# Patient Record
Sex: Female | Born: 1955 | Race: White | Hispanic: No | Marital: Single | Smoking: Former smoker
Health system: Southern US, Community
[De-identification: ages and names within clinical notes are randomized; demographics above are authoritative.]

## PROBLEM LIST (undated history)

## (undated) HISTORY — PX: TONSILLECTOMY: SUR1361

---

## 1999-04-15 ENCOUNTER — Other Ambulatory Visit: Admission: RE | Admit: 1999-04-15 | Discharge: 1999-04-15 | Payer: Self-pay | Admitting: Family Medicine

## 2001-11-22 ENCOUNTER — Other Ambulatory Visit: Admission: RE | Admit: 2001-11-22 | Discharge: 2001-11-22 | Payer: Self-pay | Admitting: Family Medicine

## 2002-04-04 ENCOUNTER — Encounter: Payer: Self-pay | Admitting: Family Medicine

## 2002-04-04 ENCOUNTER — Encounter: Admission: RE | Admit: 2002-04-04 | Discharge: 2002-04-04 | Payer: Self-pay | Admitting: Family Medicine

## 2003-06-03 ENCOUNTER — Encounter: Admission: RE | Admit: 2003-06-03 | Discharge: 2003-06-03 | Payer: Self-pay | Admitting: Family Medicine

## 2005-09-17 ENCOUNTER — Other Ambulatory Visit: Admission: RE | Admit: 2005-09-17 | Discharge: 2005-09-17 | Payer: Self-pay | Admitting: Family Medicine

## 2014-01-26 ENCOUNTER — Emergency Department (HOSPITAL_COMMUNITY): Payer: Managed Care, Other (non HMO)

## 2014-01-26 ENCOUNTER — Inpatient Hospital Stay (HOSPITAL_COMMUNITY)
Admission: EM | Admit: 2014-01-26 | Discharge: 2014-02-03 | DRG: 200 | Disposition: A | Discharge status: Home / Self Care | Payer: Managed Care, Other (non HMO) | Attending: Surgery | Admitting: Surgery

## 2014-01-26 ENCOUNTER — Encounter (HOSPITAL_COMMUNITY): Payer: Self-pay | Admitting: Emergency Medicine

## 2014-01-26 DIAGNOSIS — J939 Pneumothorax, unspecified: Secondary | ICD-10-CM

## 2014-01-26 DIAGNOSIS — S0180XA Unspecified open wound of other part of head, initial encounter: Secondary | ICD-10-CM | POA: Diagnosis present

## 2014-01-26 DIAGNOSIS — S02609A Fracture of mandible, unspecified, initial encounter for closed fracture: Secondary | ICD-10-CM | POA: Diagnosis present

## 2014-01-26 DIAGNOSIS — I959 Hypotension, unspecified: Secondary | ICD-10-CM | POA: Diagnosis not present

## 2014-01-26 DIAGNOSIS — Z87891 Personal history of nicotine dependence: Secondary | ICD-10-CM | POA: Diagnosis not present

## 2014-01-26 DIAGNOSIS — D62 Acute posthemorrhagic anemia: Secondary | ICD-10-CM | POA: Diagnosis not present

## 2014-01-26 DIAGNOSIS — S0003XA Contusion of scalp, initial encounter: Secondary | ICD-10-CM | POA: Diagnosis present

## 2014-01-26 DIAGNOSIS — S2243XA Multiple fractures of ribs, bilateral, initial encounter for closed fracture: Secondary | ICD-10-CM | POA: Diagnosis present

## 2014-01-26 DIAGNOSIS — Z880 Allergy status to penicillin: Secondary | ICD-10-CM

## 2014-01-26 DIAGNOSIS — S1093XA Contusion of unspecified part of neck, initial encounter: Secondary | ICD-10-CM

## 2014-01-26 DIAGNOSIS — J9819 Other pulmonary collapse: Secondary | ICD-10-CM | POA: Diagnosis present

## 2014-01-26 DIAGNOSIS — S2249XA Multiple fractures of ribs, unspecified side, initial encounter for closed fracture: Secondary | ICD-10-CM | POA: Diagnosis present

## 2014-01-26 DIAGNOSIS — J9383 Other pneumothorax: Secondary | ICD-10-CM | POA: Diagnosis present

## 2014-01-26 DIAGNOSIS — S271XXA Traumatic hemothorax, initial encounter: Principal | ICD-10-CM | POA: Diagnosis present

## 2014-01-26 DIAGNOSIS — Z888 Allergy status to other drugs, medicaments and biological substances status: Secondary | ICD-10-CM | POA: Diagnosis not present

## 2014-01-26 DIAGNOSIS — W64XXXA Exposure to other animate mechanical forces, initial encounter: Secondary | ICD-10-CM | POA: Diagnosis present

## 2014-01-26 DIAGNOSIS — S20219A Contusion of unspecified front wall of thorax, initial encounter: Secondary | ICD-10-CM | POA: Diagnosis present

## 2014-01-26 DIAGNOSIS — S0083XA Contusion of other part of head, initial encounter: Secondary | ICD-10-CM | POA: Diagnosis present

## 2014-01-26 DIAGNOSIS — S272XXA Traumatic hemopneumothorax, initial encounter: Secondary | ICD-10-CM

## 2014-01-26 DIAGNOSIS — S0181XA Laceration without foreign body of other part of head, initial encounter: Secondary | ICD-10-CM | POA: Diagnosis present

## 2014-01-26 LAB — COMPREHENSIVE METABOLIC PANEL
ALT: 16 U/L (ref 0–35)
AST: 22 U/L (ref 0–37)
Albumin: 3.9 g/dL (ref 3.5–5.2)
Alkaline Phosphatase: 74 U/L (ref 39–117)
Anion gap: 13 (ref 5–15)
BUN: 21 mg/dL (ref 6–23)
CALCIUM: 9.6 mg/dL (ref 8.4–10.5)
CO2: 24 mEq/L (ref 19–32)
Chloride: 101 mEq/L (ref 96–112)
Creatinine, Ser: 0.88 mg/dL (ref 0.50–1.10)
GFR calc non Af Amer: 72 mL/min — ABNORMAL LOW (ref >90)
GFR, EST AFRICAN AMERICAN: 84 mL/min — AB (ref >90)
Glucose, Bld: 155 mg/dL — ABNORMAL HIGH (ref 70–99)
Potassium: 3.5 mEq/L — ABNORMAL LOW (ref 3.7–5.3)
SODIUM: 138 meq/L (ref 137–147)
TOTAL PROTEIN: 6.6 g/dL (ref 6.0–8.3)
Total Bilirubin: 0.3 mg/dL (ref 0.3–1.2)

## 2014-01-26 LAB — CBC
HCT: 39.8 % (ref 36.0–46.0)
HEMOGLOBIN: 13.7 g/dL (ref 12.0–15.0)
MCH: 30.4 pg (ref 26.0–34.0)
MCHC: 34.4 g/dL (ref 30.0–36.0)
MCV: 88.2 fL (ref 78.0–100.0)
Platelets: 291 10*3/uL (ref 150–400)
RBC: 4.51 MIL/uL (ref 3.87–5.11)
RDW: 12.7 % (ref 11.5–15.5)
WBC: 21.1 10*3/uL — ABNORMAL HIGH (ref 4.0–10.5)

## 2014-01-26 LAB — CDS SEROLOGY

## 2014-01-26 LAB — SAMPLE TO BLOOD BANK

## 2014-01-26 LAB — PROTIME-INR
INR: 1 (ref 0.00–1.49)
Prothrombin Time: 13.2 seconds (ref 11.6–15.2)

## 2014-01-26 LAB — ETHANOL

## 2014-01-26 MED ORDER — MORPHINE SULFATE 2 MG/ML IJ SOLN
INTRAMUSCULAR | Status: AC
  Administered 2014-01-26: 2 mg via INTRAVENOUS
  Filled 2014-01-26: qty 2

## 2014-01-26 MED ORDER — MIDAZOLAM HCL 2 MG/2ML IJ SOLN
INTRAMUSCULAR | Status: AC
Start: 2014-01-26 — End: 2014-01-26
  Administered 2014-01-26: 2 mg
  Administered 2014-01-26: 2 mg via INTRAVENOUS
  Filled 2014-01-26: qty 4

## 2014-01-26 MED ORDER — FENTANYL CITRATE 0.05 MG/ML IJ SOLN
50.0000 ug | Freq: Once | INTRAMUSCULAR | Status: AC
  Administered 2014-01-26: 50 ug via INTRAVENOUS

## 2014-01-26 MED ORDER — PANTOPRAZOLE SODIUM 40 MG PO TBEC
40.0000 mg | DELAYED_RELEASE_TABLET | Freq: Every day | ORAL | Status: DC
  Administered 2014-01-27: 40 mg via ORAL
  Filled 2014-01-26: qty 1

## 2014-01-26 MED ORDER — HYDROMORPHONE HCL 1 MG/ML IJ SOLN
1.0000 mg | INTRAMUSCULAR | Status: DC | PRN
  Administered 2014-01-27 – 2014-01-29 (×7): 1 mg via INTRAVENOUS
  Filled 2014-01-26 (×3): qty 1

## 2014-01-26 MED ORDER — HYDROMORPHONE HCL 1 MG/ML IJ SOLN
1.0000 mg | INTRAMUSCULAR | Status: DC | PRN
  Administered 2014-01-27 – 2014-01-29 (×4): 1 mg via INTRAVENOUS
  Filled 2014-01-26 (×8): qty 1

## 2014-01-26 MED ORDER — SODIUM CHLORIDE 0.9 % IV BOLUS (SEPSIS)
500.0000 mL | Freq: Once | INTRAVENOUS | Status: AC
  Administered 2014-01-26: 500 mL via INTRAVENOUS

## 2014-01-26 MED ORDER — ONDANSETRON HCL 4 MG/2ML IJ SOLN
4.0000 mg | Freq: Four times a day (QID) | INTRAMUSCULAR | Status: DC | PRN
  Administered 2014-01-27 – 2014-01-29 (×4): 4 mg via INTRAVENOUS
  Filled 2014-01-26 (×4): qty 2

## 2014-01-26 MED ORDER — IOHEXOL 300 MG/ML  SOLN
100.0000 mL | Freq: Once | INTRAMUSCULAR | Status: AC | PRN
  Administered 2014-01-26: 100 mL via INTRAVENOUS

## 2014-01-26 MED ORDER — DEXTROSE-NACL 5-0.9 % IV SOLN
INTRAVENOUS | Status: DC
  Administered 2014-01-26 – 2014-01-27 (×2): via INTRAVENOUS

## 2014-01-26 MED ORDER — CLINDAMYCIN PHOSPHATE 600 MG/50ML IV SOLN
600.0000 mg | Freq: Once | INTRAVENOUS | Status: AC
  Administered 2014-01-26: 600 mg via INTRAVENOUS
  Filled 2014-01-26: qty 50

## 2014-01-26 MED ORDER — OXYCODONE HCL 5 MG PO TABS
10.0000 mg | ORAL_TABLET | ORAL | Status: DC | PRN
  Administered 2014-01-27 (×2): 10 mg via ORAL
  Administered 2014-01-28: 5 mg via ORAL
  Administered 2014-01-28 – 2014-01-29 (×3): 10 mg via ORAL
  Filled 2014-01-26 (×6): qty 2

## 2014-01-26 MED ORDER — ENOXAPARIN SODIUM 40 MG/0.4ML ~~LOC~~ SOLN
40.0000 mg | SUBCUTANEOUS | Status: DC
  Administered 2014-01-29: 40 mg via SUBCUTANEOUS
  Filled 2014-01-26 (×4): qty 0.4

## 2014-01-26 MED ORDER — MORPHINE SULFATE 2 MG/ML IJ SOLN
INTRAMUSCULAR | Status: AC
  Filled 2014-01-26: qty 1

## 2014-01-26 MED ORDER — LIDOCAINE-EPINEPHRINE 1 %-1:100000 IJ SOLN
30.0000 mL | Freq: Once | INTRAMUSCULAR | Status: AC
  Administered 2014-01-26: 30 mL via INTRADERMAL
  Filled 2014-01-26: qty 1

## 2014-01-26 MED ORDER — PANTOPRAZOLE SODIUM 40 MG IV SOLR
40.0000 mg | Freq: Every day | INTRAVENOUS | Status: DC
  Administered 2014-01-26 – 2014-01-28 (×2): 40 mg via INTRAVENOUS
  Filled 2014-01-26 (×3): qty 40

## 2014-01-26 MED ORDER — FENTANYL CITRATE 0.05 MG/ML IJ SOLN
INTRAMUSCULAR | Status: AC
  Filled 2014-01-26: qty 2

## 2014-01-26 MED ORDER — ONDANSETRON HCL 4 MG PO TABS: 4.0000 mg | ORAL_TABLET | Freq: Four times a day (QID) | ORAL | Status: DC | PRN

## 2014-01-26 NOTE — Procedures (Signed)
Chest Tube Insertion Procedure Note left   Indications:  Clinically significant Pneumothorax left   Pre-operative Diagnosis: Pneumothorax  Post-operative Diagnosis: Pneumothorax  Procedure Details  Informed consent was obtained for the procedure, including sedation.  Risks of lung perforation, hemorrhage, arrhythmia, and adverse drug reaction were discussed.   After sterile skin prep, using standard technique, a 28 French tube was placed in the left anterior 5 th rib space.  Findings: Air and small amount of fluid   Estimated Blood Loss:  Minimal         Specimens:  None              Complications:  None; patient tolerated the procedure well.         Disposition: to SDU stable         Condition: stable  Attending Attestation: I performed the procedure.

## 2014-01-26 NOTE — ED Notes (Signed)
Patient presents via EMS  She was walking her horses and I got spooked.  She got tangled up in them and fell to the ground.  Laceration above the right eye, abrasion to the chin, road rash to the right upper arm.  EMS reports flail chest to the left with + breath sounds.  IV 18 gauge in the left hand  Fentanyl given

## 2014-01-26 NOTE — ED Notes (Signed)
CT inserted by Dr Luisa Hart, dressing applied, low suction

## 2014-01-26 NOTE — ED Provider Notes (Signed)
CSN: [id number]     Arrival date & time 01/26/14  1856 History   First MD Initiated Contact with Patient 01/26/14 1919     Chief Complaint  Patient presents with  . Fall     (Consider location/radiation/quality/duration/timing/severity/associated sxs/prior Treatment) HPI Comments: Pt brought in by EMS.  She was walking her horses and they got spooked.  They knocked her to the ground and she go trampled by one of the horses.  She complains mostly of pain to her left ribs.  Pain is constant and worse with movement, breathing.  No SOB.  Has pain to her chin with associated lacerations.  Not sure if she had a complete LOC.  No neck or back pain.   History reviewed. No pertinent past medical history. Past Surgical History  Procedure Laterality Date  . Tonsillectomy     No family history on file. History  Substance Use Topics  . Smoking status: Former Games developer  . Smokeless tobacco: Never Used  . Alcohol Use: Yes     Comment: ocassionally   OB History   Grav Para Term Preterm Abortions TAB SAB Ect Mult Living                 Review of Systems  Constitutional: Negative for fever, chills, diaphoresis and fatigue.  HENT: Positive for facial swelling. Negative for congestion, rhinorrhea and sneezing.   Eyes: Negative.   Respiratory: Negative for cough, chest tightness and shortness of breath.   Cardiovascular: Positive for chest pain. Negative for leg swelling.  Gastrointestinal: Negative for nausea, vomiting, abdominal pain, diarrhea and blood in stool.  Genitourinary: Negative for frequency, hematuria, flank pain and difficulty urinating.  Musculoskeletal: Negative for arthralgias, back pain and neck pain.  Skin: Positive for wound. Negative for rash.  Neurological: Negative for dizziness, speech difficulty, weakness, numbness and headaches.      Allergies  Penicillins and Other  Home Medications   Prior to Admission medications   Medication Sig Start Date End Date Taking?  Authorizing Provider  ibuprofen (ADVIL,MOTRIN) 200 MG tablet Take 400 mg by mouth every 6 (six) hours as needed for moderate pain.   Yes Historical Provider, MD   BP 95/62  Pulse 81  Temp(Src) 98.1 F (36.7 C) (Oral)  Resp 18  Ht 5' 6.5" (1.689 m)  Wt 136 lb (61.689 kg)  BMI 21.62 kg/m2  SpO2 96% Physical Exam  Constitutional: She is oriented to person, place, and time. She appears well-developed and well-nourished.  HENT:  Head: Normocephalic and atraumatic.  Abrasion/laceration to chin.  Small areas of chipped teeth.  Small laceration to inner aspect of lower lip. 1cm laceration just inferior to lower lip.  Does not cross vermilian border.  Two 1cm lacerations to underside of chin.  0.5 laceration just superior to right eyebrow  Eyes: Pupils are equal, round, and reactive to light.  Neck: Normal range of motion. Neck supple.  No pain to the cervical, thoracic or LS spine  Cardiovascular: Normal rate, regular rhythm and normal heart sounds.   Pulmonary/Chest: Effort normal and breath sounds normal. No respiratory distress. She has no wheezes. She has no rales. She exhibits tenderness.  +hematoma, tenderness, abrasion to left posterior chest with underlying crepitus.  No flail segment appreciated  Abdominal: Soft. Bowel sounds are normal. There is tenderness (some TTP LUQ). There is no rebound and no guarding.  Musculoskeletal: Normal range of motion. She exhibits no edema.  Abrasion to right posterior shoulder.  No pain on palpation or  ROM of extremities  Lymphadenopathy:    She has no cervical adenopathy.  Neurological: She is alert and oriented to person, place, and time. She has normal strength. No sensory deficit.  Skin: Skin is warm and dry. No rash noted.  Psychiatric: She has a normal mood and affect.    ED Course  LACERATION REPAIR Date/Time: 01/27/2014 12:04 AM Performed by: Saphyre Cillo Authorized by: Rolan Bucco Consent: Verbal consent obtained. Risks and  benefits: risks, benefits and alternatives were discussed Consent given by: patient and spouse Body area: head/neck Laceration length: 3.5 cm Anesthesia: local infiltration Local anesthetic: lidocaine 1% with epinephrine and lidocaine 1% without epinephrine Anesthetic total: 6 ml Preparation: Patient was prepped and draped in the usual sterile fashion. Irrigation solution: saline Irrigation method: syringe Amount of cleaning: standard Debridement: none Degree of undermining: none Skin closure: 6-0 Prolene and glue Comments: The laceration to the forehead was closed with glue.  The 3 lacerations to the chin had 4, 3, 4 sutures placed.   (including critical care time) Labs Review Labs Reviewed  COMPREHENSIVE METABOLIC PANEL - Abnormal; Notable for the following:    Potassium 3.5 (*)    Glucose, Bld 155 (*)    GFR calc non Af Amer 72 (*)    GFR calc Af Amer 84 (*)    All other components within normal limits  CBC - Abnormal; Notable for the following:    WBC 21.1 (*)    All other components within normal limits  CDS SEROLOGY  ETHANOL  PROTIME-INR  CBC  COMPREHENSIVE METABOLIC PANEL  SAMPLE TO BLOOD BANK    Imaging Review Ct Head Wo Contrast  01/26/2014   CLINICAL DATA:  Coarse injury, fall, laceration above right eye.  EXAM: CT HEAD WITHOUT CONTRAST  CT MAXILLOFACIAL WITHOUT CONTRAST  CT CERVICAL SPINE WITHOUT CONTRAST  TECHNIQUE: Multidetector CT imaging of the head, cervical spine, and maxillofacial structures were performed using the standard protocol without intravenous contrast. Multiplanar CT image reconstructions of the cervical spine and maxillofacial structures were also generated.  COMPARISON:  None.  FINDINGS: CT HEAD FINDINGS  No intracranial hemorrhage. No parenchymal contusion. No midline shift or mass effect. Basilar cisterns are patent. No skull base fracture. No fluid in the paranasal sinuses or mastoid air cells. Orbits are normal.  CT MAXILLOFACIAL FINDINGS  No  evidence of orbital rim fracture. The zygomatic arches are intact. The globes are normal the intraconal contents are normal. There is a small skin laceration over the right orbit. No evidence of maxillary bone fracture. There is no fluid in the maxillary sinuses. The pterygoid plates are normal. The maxillary bone is normal.  The mandibular condyles are located. There is nondisplaced fracture through the angle of the left mandible (image 28, series 302 and image 52, series 308.) This fracture extends from the angle of the mandible anterior to the alveolar ridge.  No injury to the deep soft tissues of face.  CT CERVICAL SPINE FINDINGS  No prevertebral soft tissue swelling. Normal alignment of cervical vertebral bodies. No loss of vertebral body height. Normal facet articulation. Normal craniocervical junction.  No evidence epidural or paraspinal hematoma.  There is a left pneumothorax. Gas dissects into the para spinal musculature on the left.  Danella Deis: 1. No intracranial trauma. 2. Left mandibular fracture extending from the angle the gel to the alveolar ridge. 3. Small laceration over the right orbit. 4. No cervical spine fracture. 5. Left pneumothorax with gas dissecting into the left paraspinal musculature.  Findings conveyed toMELANIE Dorleen Kissel on 01/26/2014  at20:46.   Electronically Signed   By: Genevive Bi M.D.   On: 01/26/2014 20:48   Ct Chest W Contrast  01/26/2014   CLINICAL DATA:  Patient status post fall horse.  EXAM: CT CHEST, ABDOMEN, AND PELVIS WITH CONTRAST  TECHNIQUE: Multidetector CT imaging of the chest, abdomen and pelvis was performed following the standard protocol during bolus administration of intravenous contrast.  CONTRAST:  OMNIPAQUE IOHEXOL 300 MG/ML  SOLN  COMPARISON:  None.  FINDINGS: CT CHEST FINDINGS  Visualized thyroid is unremarkable. No enlarged axillary, mediastinal or hilar lymphadenopathy. Normal heart size. No pericardial effusion. Aorta and main pulmonary artery  normal in caliber.  There is a moderate sized left-sided pneumothorax. Small left-sided pleural effusion. There are ground-glass and consolidative opacities within the collapsed left upper, and left lower lobes. Dependent ground-glass opacities within the right lower lobe, likely atelectasis.  CT ABDOMEN AND PELVIS FINDINGS  There is a 2.9 cm cyst within the posterior right hepatic lobe. There is an additional adjacent 1 cm cyst in the right hepatic lobe. Fatty deposition adjacent to the falciform ligament.  Spleen, pancreas and bilateral adrenal glands are unremarkable. 1 cm simple cyst within the superior pole of the left kidney.  Normal caliber abdominal aorta. No retroperitoneal lymphadenopathy. Urinary bladder is mildly distended. Uterus is unremarkable.  The stool is present throughout the colon. Additionally there is flocculent material demonstrated within the distal small bowel. Normal appendix.  There is a minimally displaced oblique fracture through the lateral left eighth rib. Markedly displaced comminuted fractures through the posterior and lateral left ninth, tenth and eleventh ribs. Minimally displaced fracture of the posterior left twelfth rib. Extensive subcutaneous emphysema within the left chest wall soft tissues. Nondisplaced fracture through the lateral aspect of the right first rib. Nondisplaced fracture through the lateral aspect of the right fourth rib.  IMPRESSION: 1. Multiple markedly comminuted and displaced left posterior and lateral rib fractures. 2. Moderate left pneumothorax.  Small left pleural effusion. 3. Consolidative opacities within the left upper and left lower lobes which may represent a combination of pulmonary contusion and atelectasis. 4. Nondisplaced right first and fourth rib fractures. 5. Extensive subcutaneous emphysema within left chest wall and dorsal soft tissues. 6. Stool throughout the colon as can be seen with constipation. Critical Value/emergent results were  called by telephone at the time of interpretation on 01/26/2014 at 8:51 pm to Dr. Shawna Orleans Kierstynn Babich , who verbally acknowledged these results.   Electronically Signed   By: Annia Belt M.D.   On: 01/26/2014 20:53   Ct Cervical Spine Wo Contrast  01/26/2014   CLINICAL DATA:  Coarse injury, fall, laceration above right eye.  EXAM: CT HEAD WITHOUT CONTRAST  CT MAXILLOFACIAL WITHOUT CONTRAST  CT CERVICAL SPINE WITHOUT CONTRAST  TECHNIQUE: Multidetector CT imaging of the head, cervical spine, and maxillofacial structures were performed using the standard protocol without intravenous contrast. Multiplanar CT image reconstructions of the cervical spine and maxillofacial structures were also generated.  COMPARISON:  None.  FINDINGS: CT HEAD FINDINGS  No intracranial hemorrhage. No parenchymal contusion. No midline shift or mass effect. Basilar cisterns are patent. No skull base fracture. No fluid in the paranasal sinuses or mastoid air cells. Orbits are normal.  CT MAXILLOFACIAL FINDINGS  No evidence of orbital rim fracture. The zygomatic arches are intact. The globes are normal the intraconal contents are normal. There is a small skin laceration over the right orbit. No evidence of maxillary bone  fracture. There is no fluid in the maxillary sinuses. The pterygoid plates are normal. The maxillary bone is normal.  The mandibular condyles are located. There is nondisplaced fracture through the angle of the left mandible (image 28, series 302 and image 52, series 308.) This fracture extends from the angle of the mandible anterior to the alveolar ridge.  No injury to the deep soft tissues of face.  CT CERVICAL SPINE FINDINGS  No prevertebral soft tissue swelling. Normal alignment of cervical vertebral bodies. No loss of vertebral body height. Normal facet articulation. Normal craniocervical junction.  No evidence epidural or paraspinal hematoma.  There is a left pneumothorax. Gas dissects into the para spinal musculature on the  left.  Danella Deis: 1. No intracranial trauma. 2. Left mandibular fracture extending from the angle the gel to the alveolar ridge. 3. Small laceration over the right orbit. 4. No cervical spine fracture. 5. Left pneumothorax with gas dissecting into the left paraspinal musculature. Findings conveyed toMELANIE Muzamil Harker on 01/26/2014  at20:46.   Electronically Signed   By: Genevive Bi M.D.   On: 01/26/2014 20:48   Ct Abdomen Pelvis W Contrast  01/26/2014   CLINICAL DATA:  Patient status post fall horse.  EXAM: CT CHEST, ABDOMEN, AND PELVIS WITH CONTRAST  TECHNIQUE: Multidetector CT imaging of the chest, abdomen and pelvis was performed following the standard protocol during bolus administration of intravenous contrast.  CONTRAST:  OMNIPAQUE IOHEXOL 300 MG/ML  SOLN  COMPARISON:  None.  FINDINGS: CT CHEST FINDINGS  Visualized thyroid is unremarkable. No enlarged axillary, mediastinal or hilar lymphadenopathy. Normal heart size. No pericardial effusion. Aorta and main pulmonary artery normal in caliber.  There is a moderate sized left-sided pneumothorax. Small left-sided pleural effusion. There are ground-glass and consolidative opacities within the collapsed left upper, and left lower lobes. Dependent ground-glass opacities within the right lower lobe, likely atelectasis.  CT ABDOMEN AND PELVIS FINDINGS  There is a 2.9 cm cyst within the posterior right hepatic lobe. There is an additional adjacent 1 cm cyst in the right hepatic lobe. Fatty deposition adjacent to the falciform ligament.  Spleen, pancreas and bilateral adrenal glands are unremarkable. 1 cm simple cyst within the superior pole of the left kidney.  Normal caliber abdominal aorta. No retroperitoneal lymphadenopathy. Urinary bladder is mildly distended. Uterus is unremarkable.  The stool is present throughout the colon. Additionally there is flocculent material demonstrated within the distal small bowel. Normal appendix.  There is a minimally  displaced oblique fracture through the lateral left eighth rib. Markedly displaced comminuted fractures through the posterior and lateral left ninth, tenth and eleventh ribs. Minimally displaced fracture of the posterior left twelfth rib. Extensive subcutaneous emphysema within the left chest wall soft tissues. Nondisplaced fracture through the lateral aspect of the right first rib. Nondisplaced fracture through the lateral aspect of the right fourth rib.  IMPRESSION: 1. Multiple markedly comminuted and displaced left posterior and lateral rib fractures. 2. Moderate left pneumothorax.  Small left pleural effusion. 3. Consolidative opacities within the left upper and left lower lobes which may represent a combination of pulmonary contusion and atelectasis. 4. Nondisplaced right first and fourth rib fractures. 5. Extensive subcutaneous emphysema within left chest wall and dorsal soft tissues. 6. Stool throughout the colon as can be seen with constipation. Critical Value/emergent results were called by telephone at the time of interpretation on 01/26/2014 at 8:51 pm to Dr. Shawna Orleans Eddis Pingleton , who verbally acknowledged these results.   Electronically Signed  By: Annia Belt M.D.   On: 01/26/2014 20:53   Dg Pelvis Portable  01/26/2014   CLINICAL DATA:  Trauma, fall from horse  EXAM: PORTABLE PELVIS 1-2 VIEWS  COMPARISON:  None.  FINDINGS: Hips are located. No evidence of pelvic fracture or sacral fracture. No evidence of femoral neck fracture on single view.  IMPRESSION: No evidence of pelvic fracture.   Electronically Signed   By: Genevive Bi M.D.   On: 01/26/2014 19:58   Dg Chest Portable 1 View  01/26/2014   CLINICAL DATA:  Pneumothorax following trauma  EXAM: PORTABLE CHEST - 1 VIEW  COMPARISON:  CT 01/26/2014  FINDINGS: Interval placement left-sided chest to. There is interval expansion of the left lung. The pleural edge is difficult to define at the left lung apex. Posterior left rib fractures are noted.  Pulmonary contusion at the left lung base noted.  IMPRESSION: 1. Interval expansion of left lung following chest tube placement. 2. Posterior left rib fractures and pulmonary contusion again noted.   Electronically Signed   By: Genevive Bi M.D.   On: 01/26/2014 21:54   Dg Chest Port 1 View  01/26/2014   CLINICAL DATA:  Trauma.  Fell from horse.  EXAM: PORTABLE CHEST - 1 VIEW  COMPARISON:  None.  FINDINGS: Cardiac silhouette is normal in size. No mediastinal or hilar masses.  There are mildly thickened interstitial markings. Mild hazy opacity projects at the left lung base. This may be in part due to extend circulation of opacity from rotation and overlying soft tissue. No evidence of pulmonary edema. No pneumothorax or pleural effusion.  Probable fracture of the lateral left tenth rib. A small amount of deep soft tissue air is seen adjacent to this. No other convincing fracture.  IMPRESSION: Probable fracture of the lateral left tenth rib supported by a small amount of deep soft tissue air.  Opacity in the left lung base is likely atelectasis in combination with chronic interstitial thickening. Lung contusion is possible.  No pleural effusion or pneumothorax.  No other acute findings.   Electronically Signed   By: Amie Portland M.D.   On: 01/26/2014 19:59   Ct Maxillofacial Wo Cm  01/26/2014   CLINICAL DATA:  Coarse injury, fall, laceration above right eye.  EXAM: CT HEAD WITHOUT CONTRAST  CT MAXILLOFACIAL WITHOUT CONTRAST  CT CERVICAL SPINE WITHOUT CONTRAST  TECHNIQUE: Multidetector CT imaging of the head, cervical spine, and maxillofacial structures were performed using the standard protocol without intravenous contrast. Multiplanar CT image reconstructions of the cervical spine and maxillofacial structures were also generated.  COMPARISON:  None.  FINDINGS: CT HEAD FINDINGS  No intracranial hemorrhage. No parenchymal contusion. No midline shift or mass effect. Basilar cisterns are patent. No skull base  fracture. No fluid in the paranasal sinuses or mastoid air cells. Orbits are normal.  CT MAXILLOFACIAL FINDINGS  No evidence of orbital rim fracture. The zygomatic arches are intact. The globes are normal the intraconal contents are normal. There is a small skin laceration over the right orbit. No evidence of maxillary bone fracture. There is no fluid in the maxillary sinuses. The pterygoid plates are normal. The maxillary bone is normal.  The mandibular condyles are located. There is nondisplaced fracture through the angle of the left mandible (image 28, series 302 and image 52, series 308.) This fracture extends from the angle of the mandible anterior to the alveolar ridge.  No injury to the deep soft tissues of face.  CT CERVICAL SPINE FINDINGS  No prevertebral soft tissue swelling. Normal alignment of cervical vertebral bodies. No loss of vertebral body height. Normal facet articulation. Normal craniocervical junction.  No evidence epidural or paraspinal hematoma.  There is a left pneumothorax. Gas dissects into the para spinal musculature on the left.  Danella Deis: 1. No intracranial trauma. 2. Left mandibular fracture extending from the angle the gel to the alveolar ridge. 3. Small laceration over the right orbit. 4. No cervical spine fracture. 5. Left pneumothorax with gas dissecting into the left paraspinal musculature. Findings conveyed toMELANIE Graycen Degan on 01/26/2014  at20:46.   Electronically Signed   By: Genevive Bi M.D.   On: 01/26/2014 20:48     EKG Interpretation None      MDM   Final diagnoses:  Pneumothorax  Facial laceration, initial encounter    Level 2 trauma activated my myself on pt arrival due to large amount of crepitus to posterior chest wall.  Patient had a pneumothorax with a large amount air in the subcutaneous tissues. The trauma surgeon place a chest tube. I contacted the oral surgeon, Dr. Chales Salmon, on call regarding the mandibular fracture. He states he will see the  patient in the morning. The patient requests that a plastic surgeon repair her facial wounds.  I recontacted Dr. Chales Salmon who requests that I repair the wounds and if he needs to revise it in the morning, he will do that.  Pt okay with that plan.  Pt started on clindamycin.  Tdap UTD.    Rolan Bucco, MD 01/27/14 [phone]

## 2014-01-26 NOTE — Progress Notes (Signed)
Chaplain met with family as nurses were working with the pt. Chaplain explored some of the history of the incident and offered emotional support for the family present. Family is very calm and attentive. Chaplain escorted them to consult B as they wait for pt to arrive from CT.  Delford Field 01/26/2014 7:58 PM

## 2014-01-26 NOTE — H&P (Signed)
History   Andrea Shaw is an 58 y.o. female.   Chief Complaint:  Chief Complaint  Patient presents with  . Fall    Fall This is a new problem. The current episode started today. The problem has been gradually worsening. Associated symptoms include chest pain, headaches and weakness.  Pt stomped by horse on left chest... Complaining of left sided pain and face pain. Pain worse with movement and breathing and better at rest.   History reviewed. No pertinent past medical history.  Past Surgical History  Procedure Laterality Date  . Tonsillectomy      No family history on file. Social History:  reports that she has quit smoking. She has never used smokeless tobacco. She reports that she drinks alcohol. She reports that she does not use illicit drugs.  Allergies   Allergies  Allergen Reactions  . Penicillins Rash  . Other Other (See Comments)    [name] causes eye redness    Home Medications   (Not in a hospital admission)  Trauma Course   Results for orders placed during the hospital encounter of 01/26/14 (from the past 48 hour(s))  CDS SEROLOGY     Status: None   Collection Time    01/26/14  7:54 PM      Result Value Ref Range   CDS serology specimen       Value: SPECIMEN WILL BE HELD FOR 14 DAYS IF TESTING IS REQUIRED  COMPREHENSIVE METABOLIC PANEL     Status: Abnormal   Collection Time    01/26/14  7:54 PM      Result Value Ref Range   Sodium 138  137 - 147 mEq/L   Potassium 3.5 (*) 3.7 - 5.3 mEq/L   Chloride 101  96 - 112 mEq/L   CO2 24  19 - 32 mEq/L   Glucose, Bld 155 (*) 70 - 99 mg/dL   BUN 21  6 - 23 mg/dL   Creatinine, Ser 0.88  0.50 - 1.10 mg/dL   Calcium 9.6  8.4 - 10.5 mg/dL   Total Protein 6.6  6.0 - 8.3 g/dL   Albumin 3.9  3.5 - 5.2 g/dL   AST 22  0 - 37 U/L   ALT 16  0 - 35 U/L   Alkaline Phosphatase 74  39 - 117 U/L   Total Bilirubin 0.3  0.3 - 1.2 mg/dL   GFR calc non Af Amer 72 (*) >90 mL/min   GFR calc Af Amer 84 (*) >90 mL/min   Comment: (NOTE)     The eGFR has been calculated using the CKD EPI equation.     This calculation has not been validated in all clinical situations.     eGFR's persistently <90 mL/min signify possible Chronic Kidney     Disease.   Anion gap 13  5 - 15  CBC     Status: Abnormal   Collection Time    01/26/14  7:54 PM      Result Value Ref Range   WBC 21.1 (*) 4.0 - 10.5 K/uL   RBC 4.51  3.87 - 5.11 MIL/uL   Hemoglobin 13.7  12.0 - 15.0 g/dL   HCT 39.8  36.0 - 46.0 %   MCV 88.2  78.0 - 100.0 fL   MCH 30.4  26.0 - 34.0 pg   MCHC 34.4  30.0 - 36.0 g/dL   RDW 12.7  11.5 - 15.5 %   Platelets 291  150 - 400 K/uL  ETHANOL  Status: None   Collection Time    01/26/14  7:54 PM      Result Value Ref Range   Alcohol, Ethyl (B) <11  0 - 11 mg/dL   Comment:            LOWEST DETECTABLE LIMIT FOR     SERUM ALCOHOL IS 11 mg/dL     FOR MEDICAL PURPOSES ONLY  PROTIME-INR     Status: None   Collection Time    01/26/14  7:54 PM      Result Value Ref Range   Prothrombin Time 13.2  11.6 - 15.2 seconds   INR 1.00  0.00 - 1.49  SAMPLE TO BLOOD BANK     Status: None   Collection Time    01/26/14  7:54 PM      Result Value Ref Range   Blood Bank Specimen SAMPLE AVAILABLE FOR TESTING     Sample Expiration 01/27/2014     Ct Head Wo Contrast  01/26/2014   CLINICAL DATA:  Coarse injury, fall, laceration above right eye.  EXAM: CT HEAD WITHOUT CONTRAST  CT MAXILLOFACIAL WITHOUT CONTRAST  CT CERVICAL SPINE WITHOUT CONTRAST  TECHNIQUE: Multidetector CT imaging of the head, cervical spine, and maxillofacial structures were performed using the standard protocol without intravenous contrast. Multiplanar CT image reconstructions of the cervical spine and maxillofacial structures were also generated.  COMPARISON:  None.  FINDINGS: CT HEAD FINDINGS  No intracranial hemorrhage. No parenchymal contusion. No midline shift or mass effect. Basilar cisterns are patent. No skull base fracture. No fluid in the paranasal  sinuses or mastoid air cells. Orbits are normal.  CT MAXILLOFACIAL FINDINGS  No evidence of orbital rim fracture. The zygomatic arches are intact. The globes are normal the intraconal contents are normal. There is a small skin laceration over the right orbit. No evidence of maxillary bone fracture. There is no fluid in the maxillary sinuses. The pterygoid plates are normal. The maxillary bone is normal.  The mandibular condyles are located. There is nondisplaced fracture through the angle of the left mandible (image 28, series 302 and image 52, series 308.) This fracture extends from the angle of the mandible anterior to the alveolar ridge.  No injury to the deep soft tissues of face.  CT CERVICAL SPINE FINDINGS  No prevertebral soft tissue swelling. Normal alignment of cervical vertebral bodies. No loss of vertebral body height. Normal facet articulation. Normal craniocervical junction.  No evidence epidural or paraspinal hematoma.  There is a left pneumothorax. Gas dissects into the para spinal musculature on the left.  Ronn Melena: 1. No intracranial trauma. 2. Left mandibular fracture extending from the angle the gel to the alveolar ridge. 3. Small laceration over the right orbit. 4. No cervical spine fracture. 5. Left pneumothorax with gas dissecting into the left paraspinal musculature. Findings conveyed toMELANIE BELFI on 01/26/2014  at20:46.   Electronically Signed   By: Suzy Bouchard M.D.   On: 01/26/2014 20:48   Ct Chest W Contrast  01/26/2014   CLINICAL DATA:  Patient status post fall horse.  EXAM: CT CHEST, ABDOMEN, AND PELVIS WITH CONTRAST  TECHNIQUE: Multidetector CT imaging of the chest, abdomen and pelvis was performed following the standard protocol during bolus administration of intravenous contrast.  CONTRAST:  133mL OMNIPAQUE IOHEXOL 300 MG/ML  SOLN  COMPARISON:  None.  FINDINGS: CT CHEST FINDINGS  Visualized thyroid is unremarkable. No enlarged axillary, mediastinal or hilar  lymphadenopathy. Normal heart size. No pericardial effusion. Aorta and  main pulmonary artery normal in caliber.  There is a moderate sized left-sided pneumothorax. Small left-sided pleural effusion. There are ground-glass and consolidative opacities within the collapsed left upper, and left lower lobes. Dependent ground-glass opacities within the right lower lobe, likely atelectasis.  CT ABDOMEN AND PELVIS FINDINGS  There is a 2.9 cm cyst within the posterior right hepatic lobe. There is an additional adjacent 1 cm cyst in the right hepatic lobe. Fatty deposition adjacent to the falciform ligament.  Spleen, pancreas and bilateral adrenal glands are unremarkable. 1 cm simple cyst within the superior pole of the left kidney.  Normal caliber abdominal aorta. No retroperitoneal lymphadenopathy. Urinary bladder is mildly distended. Uterus is unremarkable.  The stool is present throughout the colon. Additionally there is flocculent material demonstrated within the distal small bowel. Normal appendix.  There is a minimally displaced oblique fracture through the lateral left eighth rib. Markedly displaced comminuted fractures through the posterior and lateral left ninth, tenth and eleventh ribs. Minimally displaced fracture of the posterior left twelfth rib. Extensive subcutaneous emphysema within the left chest wall soft tissues. Nondisplaced fracture through the lateral aspect of the right first rib. Nondisplaced fracture through the lateral aspect of the right fourth rib.  IMPRESSION: 1. Multiple markedly comminuted and displaced left posterior and lateral rib fractures. 2. Moderate left pneumothorax.  Small left pleural effusion. 3. Consolidative opacities within the left upper and left lower lobes which may represent a combination of pulmonary contusion and atelectasis. 4. Nondisplaced right first and fourth rib fractures. 5. Extensive subcutaneous emphysema within left chest wall and dorsal soft tissues. 6. Stool  throughout the colon as can be seen with constipation. Critical Value/emergent results were called by telephone at the time of interpretation on 01/26/2014 at 8:51 pm to Dr. Shawna Orleans BELFI , who verbally acknowledged these results.   Electronically Signed   By: Annia Belt M.D.   On: 01/26/2014 20:53   Ct Cervical Spine Wo Contrast  01/26/2014   CLINICAL DATA:  Coarse injury, fall, laceration above right eye.  EXAM: CT HEAD WITHOUT CONTRAST  CT MAXILLOFACIAL WITHOUT CONTRAST  CT CERVICAL SPINE WITHOUT CONTRAST  TECHNIQUE: Multidetector CT imaging of the head, cervical spine, and maxillofacial structures were performed using the standard protocol without intravenous contrast. Multiplanar CT image reconstructions of the cervical spine and maxillofacial structures were also generated.  COMPARISON:  None.  FINDINGS: CT HEAD FINDINGS  No intracranial hemorrhage. No parenchymal contusion. No midline shift or mass effect. Basilar cisterns are patent. No skull base fracture. No fluid in the paranasal sinuses or mastoid air cells. Orbits are normal.  CT MAXILLOFACIAL FINDINGS  No evidence of orbital rim fracture. The zygomatic arches are intact. The globes are normal the intraconal contents are normal. There is a small skin laceration over the right orbit. No evidence of maxillary bone fracture. There is no fluid in the maxillary sinuses. The pterygoid plates are normal. The maxillary bone is normal.  The mandibular condyles are located. There is nondisplaced fracture through the angle of the left mandible (image 28, series 302 and image 52, series 308.) This fracture extends from the angle of the mandible anterior to the alveolar ridge.  No injury to the deep soft tissues of face.  CT CERVICAL SPINE FINDINGS  No prevertebral soft tissue swelling. Normal alignment of cervical vertebral bodies. No loss of vertebral body height. Normal facet articulation. Normal craniocervical junction.  No evidence epidural or paraspinal  hematoma.  There is a left pneumothorax. Gas dissects  into the para spinal musculature on the left.  Ronn Melena: 1. No intracranial trauma. 2. Left mandibular fracture extending from the angle the gel to the alveolar ridge. 3. Small laceration over the right orbit. 4. No cervical spine fracture. 5. Left pneumothorax with gas dissecting into the left paraspinal musculature. Findings conveyed toMELANIE BELFI on 01/26/2014  at20:46.   Electronically Signed   By: Suzy Bouchard M.D.   On: 01/26/2014 20:48   Ct Abdomen Pelvis W Contrast  01/26/2014   CLINICAL DATA:  Patient status post fall horse.  EXAM: CT CHEST, ABDOMEN, AND PELVIS WITH CONTRAST  TECHNIQUE: Multidetector CT imaging of the chest, abdomen and pelvis was performed following the standard protocol during bolus administration of intravenous contrast.  CONTRAST:  163mL OMNIPAQUE IOHEXOL 300 MG/ML  SOLN  COMPARISON:  None.  FINDINGS: CT CHEST FINDINGS  Visualized thyroid is unremarkable. No enlarged axillary, mediastinal or hilar lymphadenopathy. Normal heart size. No pericardial effusion. Aorta and main pulmonary artery normal in caliber.  There is a moderate sized left-sided pneumothorax. Small left-sided pleural effusion. There are ground-glass and consolidative opacities within the collapsed left upper, and left lower lobes. Dependent ground-glass opacities within the right lower lobe, likely atelectasis.  CT ABDOMEN AND PELVIS FINDINGS  There is a 2.9 cm cyst within the posterior right hepatic lobe. There is an additional adjacent 1 cm cyst in the right hepatic lobe. Fatty deposition adjacent to the falciform ligament.  Spleen, pancreas and bilateral adrenal glands are unremarkable. 1 cm simple cyst within the superior pole of the left kidney.  Normal caliber abdominal aorta. No retroperitoneal lymphadenopathy. Urinary bladder is mildly distended. Uterus is unremarkable.  The stool is present throughout the colon. Additionally there is flocculent  material demonstrated within the distal small bowel. Normal appendix.  There is a minimally displaced oblique fracture through the lateral left eighth rib. Markedly displaced comminuted fractures through the posterior and lateral left ninth, tenth and eleventh ribs. Minimally displaced fracture of the posterior left twelfth rib. Extensive subcutaneous emphysema within the left chest wall soft tissues. Nondisplaced fracture through the lateral aspect of the right first rib. Nondisplaced fracture through the lateral aspect of the right fourth rib.  IMPRESSION: 1. Multiple markedly comminuted and displaced left posterior and lateral rib fractures. 2. Moderate left pneumothorax.  Small left pleural effusion. 3. Consolidative opacities within the left upper and left lower lobes which may represent a combination of pulmonary contusion and atelectasis. 4. Nondisplaced right first and fourth rib fractures. 5. Extensive subcutaneous emphysema within left chest wall and dorsal soft tissues. 6. Stool throughout the colon as can be seen with constipation. Critical Value/emergent results were called by telephone at the time of interpretation on 01/26/2014 at 8:51 pm to Dr. Threasa Beards BELFI , who verbally acknowledged these results.   Electronically Signed   By: Lovey Newcomer M.D.   On: 01/26/2014 20:53   Dg Pelvis Portable  01/26/2014   CLINICAL DATA:  Trauma, fall from horse  EXAM: PORTABLE PELVIS 1-2 VIEWS  COMPARISON:  None.  FINDINGS: Hips are located. No evidence of pelvic fracture or sacral fracture. No evidence of femoral neck fracture on single view.  IMPRESSION: No evidence of pelvic fracture.   Electronically Signed   By: Suzy Bouchard M.D.   On: 01/26/2014 19:58   Dg Chest Port 1 View  01/26/2014   CLINICAL DATA:  Trauma.  Fell from horse.  EXAM: PORTABLE CHEST - 1 VIEW  COMPARISON:  None.  FINDINGS: Cardiac  silhouette is normal in size. No mediastinal or hilar masses.  There are mildly thickened interstitial  markings. Mild hazy opacity projects at the left lung base. This may be in part due to extend circulation of opacity from rotation and overlying soft tissue. No evidence of pulmonary edema. No pneumothorax or pleural effusion.  Probable fracture of the lateral left tenth rib. A small amount of deep soft tissue air is seen adjacent to this. No other convincing fracture.  IMPRESSION: Probable fracture of the lateral left tenth rib supported by a small amount of deep soft tissue air.  Opacity in the left lung base is likely atelectasis in combination with chronic interstitial thickening. Lung contusion is possible.  No pleural effusion or pneumothorax.  No other acute findings.   Electronically Signed   By: Lajean Manes M.D.   On: 01/26/2014 19:59   Ct Maxillofacial Wo Cm  01/26/2014   CLINICAL DATA:  Coarse injury, fall, laceration above right eye.  EXAM: CT HEAD WITHOUT CONTRAST  CT MAXILLOFACIAL WITHOUT CONTRAST  CT CERVICAL SPINE WITHOUT CONTRAST  TECHNIQUE: Multidetector CT imaging of the head, cervical spine, and maxillofacial structures were performed using the standard protocol without intravenous contrast. Multiplanar CT image reconstructions of the cervical spine and maxillofacial structures were also generated.  COMPARISON:  None.  FINDINGS: CT HEAD FINDINGS  No intracranial hemorrhage. No parenchymal contusion. No midline shift or mass effect. Basilar cisterns are patent. No skull base fracture. No fluid in the paranasal sinuses or mastoid air cells. Orbits are normal.  CT MAXILLOFACIAL FINDINGS  No evidence of orbital rim fracture. The zygomatic arches are intact. The globes are normal the intraconal contents are normal. There is a small skin laceration over the right orbit. No evidence of maxillary bone fracture. There is no fluid in the maxillary sinuses. The pterygoid plates are normal. The maxillary bone is normal.  The mandibular condyles are located. There is nondisplaced fracture through the  angle of the left mandible (image 28, series 302 and image 52, series 308.) This fracture extends from the angle of the mandible anterior to the alveolar ridge.  No injury to the deep soft tissues of face.  CT CERVICAL SPINE FINDINGS  No prevertebral soft tissue swelling. Normal alignment of cervical vertebral bodies. No loss of vertebral body height. Normal facet articulation. Normal craniocervical junction.  No evidence epidural or paraspinal hematoma.  There is a left pneumothorax. Gas dissects into the para spinal musculature on the left.  Ronn Melena: 1. No intracranial trauma. 2. Left mandibular fracture extending from the angle the gel to the alveolar ridge. 3. Small laceration over the right orbit. 4. No cervical spine fracture. 5. Left pneumothorax with gas dissecting into the left paraspinal musculature. Findings conveyed toMELANIE BELFI on 01/26/2014  at20:46.   Electronically Signed   By: Suzy Bouchard M.D.   On: 01/26/2014 20:48    Review of Systems  Cardiovascular: Positive for chest pain.  Gastrointestinal: Negative.   Neurological: Positive for weakness and headaches.  Endo/Heme/Allergies: Negative.   Psychiatric/Behavioral: Negative.     Blood pressure 107/75, pulse 85, temperature 98.1 F (36.7 C), temperature source Oral, resp. rate 25, height 5' 6.5" (1.689 m), weight 136 lb (61.689 kg), SpO2 94.00%. Physical Exam  Constitutional: She is oriented to person, place, and time. She appears well-developed and well-nourished.  HENT:  Head: Head is with contusion.    Right Ear: Hearing and tympanic membrane normal.  Left Ear: Hearing and tympanic membrane normal.  Nose: Nose normal.  Mouth/Throat: No oropharyngeal exudate.  Eyes: EOM are normal. Pupils are equal, round, and reactive to light. No scleral icterus.  Neck: Normal range of motion. Neck supple.  Non tender FROM not in collar when I arrived  Cardiovascular: Normal rate and regular rhythm.   Respiratory: She has  decreased breath sounds in the left middle field and the left lower field. She exhibits tenderness and swelling.    GI: Soft. Bowel sounds are normal. She exhibits no distension. There is no tenderness.  Musculoskeletal: Normal range of motion.  Pelvis stable  Neurological: She is alert and oriented to person, place, and time.  Skin: Skin is warm and dry.  Psychiatric: She has a normal mood and affect. Her behavior is normal. Judgment and thought content normal.     Assessment/Plan Fall/trampled by horse Level 2 trauma Left PTX will place CT Left rib fracture 02/18/11 posterior Right rib fractures 1-4 Mandibular fracture/ lacerations  Face call contacted by EDP and to be evaluated by them  SDU tonight  Pulmonary toilet Pain control Discussed with family.   Jaydy Fitzhenry A. 01/26/2014, 9:37 PM   Procedures

## 2014-01-27 ENCOUNTER — Inpatient Hospital Stay (HOSPITAL_COMMUNITY): Payer: Managed Care, Other (non HMO)

## 2014-01-27 LAB — COMPREHENSIVE METABOLIC PANEL
ALT: 17 U/L (ref 0–35)
AST: 34 U/L (ref 0–37)
Albumin: 3.1 g/dL — ABNORMAL LOW (ref 3.5–5.2)
Alkaline Phosphatase: 53 U/L (ref 39–117)
Anion gap: 12 (ref 5–15)
BILIRUBIN TOTAL: 0.4 mg/dL (ref 0.3–1.2)
BUN: 17 mg/dL (ref 6–23)
CHLORIDE: 105 meq/L (ref 96–112)
CO2: 22 meq/L (ref 19–32)
Calcium: 8.5 mg/dL (ref 8.4–10.5)
Creatinine, Ser: 0.69 mg/dL (ref 0.50–1.10)
GFR calc Af Amer: >90 mL/min (ref >90)
Glucose, Bld: 150 mg/dL — ABNORMAL HIGH (ref 70–99)
Potassium: 5.4 mEq/L — ABNORMAL HIGH (ref 3.7–5.3)
SODIUM: 139 meq/L (ref 137–147)
Total Protein: 5.8 g/dL — ABNORMAL LOW (ref 6.0–8.3)

## 2014-01-27 LAB — CBC
HCT: 33.5 % — ABNORMAL LOW (ref 36.0–46.0)
Hemoglobin: 11.3 g/dL — ABNORMAL LOW (ref 12.0–15.0)
MCH: 30.8 pg (ref 26.0–34.0)
MCHC: 33.7 g/dL (ref 30.0–36.0)
MCV: 91.3 fL (ref 78.0–100.0)
PLATELETS: 271 10*3/uL (ref 150–400)
RBC: 3.67 MIL/uL — AB (ref 3.87–5.11)
RDW: 13 % (ref 11.5–15.5)
WBC: 9.7 10*3/uL (ref 4.0–10.5)

## 2014-01-27 LAB — MRSA PCR SCREENING: MRSA by PCR: NEGATIVE

## 2014-01-27 MED ORDER — BACITRACIN-NEOMYCIN-POLYMYXIN OINTMENT TUBE
TOPICAL_OINTMENT | Freq: Every day | CUTANEOUS | Status: DC
  Administered 2014-01-28 – 2014-01-31 (×4): via TOPICAL
  Administered 2014-02-01: 1 via TOPICAL
  Administered 2014-02-01: via TOPICAL
  Administered 2014-02-03: 1 via TOPICAL
  Filled 2014-01-27 (×3): qty 15

## 2014-01-27 MED ORDER — SODIUM CHLORIDE 0.9 % IV BOLUS (SEPSIS)
500.0000 mL | Freq: Once | INTRAVENOUS | Status: AC
  Administered 2014-01-27: 500 mL via INTRAVENOUS

## 2014-01-27 MED ORDER — CEFAZOLIN SODIUM 1-5 GM-% IV SOLN
1.0000 g | Freq: Three times a day (TID) | INTRAVENOUS | Status: DC
  Administered 2014-01-27 – 2014-01-29 (×5): 1 g via INTRAVENOUS
  Filled 2014-01-27 (×8): qty 50

## 2014-01-27 MED ORDER — INFLUENZA VAC SPLIT QUAD 0.5 ML IM SUSY
0.5000 mL | PREFILLED_SYRINGE | INTRAMUSCULAR | Status: DC
  Filled 2014-01-27: qty 0.5

## 2014-01-27 NOTE — ED Notes (Signed)
Patient moved to other bed and stated that the pain med took the edge off of the pain allowing her to move

## 2014-01-27 NOTE — ED Notes (Signed)
Patient requesting to use the [facility] but started feeling nauseated.  Zofran given.  Due to pain unable to get up to use the [facility] and used the bedpan without difficulty. Stated she could use some pain med before moving over to the hospital bed.

## 2014-01-27 NOTE — ED Notes (Addendum)
SCD's placed on patient.  Used bedpan  Pillow placed under left shoulder and back to reposition  Patient used Incentive Spirometer 5 times getting to 500

## 2014-01-27 NOTE — ED Notes (Signed)
Report to Megan, RN.

## 2014-01-27 NOTE — ED Notes (Signed)
Dr. Luisa Hart was called regarding SBP less than 95.

## 2014-01-27 NOTE — Consult Note (Signed)
Ms. Ikner is a 58 yo female, stepped on by a horse on the chest and face, with pneumothorax, 2 facial lacerations (right brow and chin) and a mandible fracture.  Currently with chest tube and chin lac closed by ED.  Maxillofacial CT reveals minimally displaced left mandibular angle fracture with no other facial fractures.    PLAN:  1. Panorex xray             2.  Continue Ancef 1 gm IV q 8 hr             3. The minimally displaced mandible fracture does not require surgical intervention and patient can be followed                  up as an outpatient.             4.  Antibiotics for 1 week PO after discharge.  Cephalosporin ok .             5.  Soft non-chew diet   Dutch Quint, MD              Office [phone]

## 2014-01-27 NOTE — Progress Notes (Signed)
Patient transferred to radiology for panorex xray. After arrival patient didn't think she would be able to stand in order to get film secondary to pain and that she hadn't gotten up yet since the accident.. Stated she would try tomorrow. Will pass along to next RN

## 2014-01-27 NOTE — ED Notes (Signed)
Pt transported to 81M-13 on hospital bed, vital signs stable. L sided chest tube intact.

## 2014-01-27 NOTE — Progress Notes (Signed)
Trauma Service Note  Subjective: Patient in the ED earlier today.  In no distress.  BP a bit soft, but the patient is not tachycardic.  Objective: Vital signs in last 24 hours: Temp:  [97.8 F (36.6 C)-99 F (37.2 C)] 99 F (37.2 C) (09/20 1555) Pulse Rate:  [72-86] 72 (09/20 1500) Resp:  [14-35] 18 (09/20 1500) BP: (83-117)/(52-75) 95/56 mmHg (09/20 1500) SpO2:  [82 %-100 %] 96 % (09/20 1500) Weight:  [61.689 kg (136 lb)] 61.689 kg (136 lb) (09/19 1859)    Intake/Output from previous day: 09/19 0701 - 09/20 0700 In: 1040 [P.O.:240; I.V.:800] Out: 400 [Urine:400] Intake/Output this shift: Total I/O In: 1741.3 [I.V.:1241.3; IV Piggyback:500] Out: -   General: No acute distress  Lungs: No air leak.  Equal breath sounds bilaterally.    CXR from last night was okay.  Abd: Benign  Extremities: No deformities  Neuro: Intact  Lab Results: CBC   Recent Labs  01/26/14 1954 01/27/14 0500  WBC 21.1* 9.7  HGB 13.7 11.3*  HCT 39.8 33.5*  PLT 291 271   BMET  Recent Labs  01/26/14 1954 01/27/14 0500  NA 138 139  K 3.5* 5.4*  CL 101 105  CO2 24 22  GLUCOSE 155* 150*  BUN 21 17  CREATININE 0.88 0.69  CALCIUM 9.6 8.5   PT/INR  Recent Labs  01/26/14 1954  LABPROT 13.2  INR 1.00   ABG No results found for this basename: PHART, PCO2, PO2, HCO3,  in the last 72 hours  Studies/Results: Ct Head Wo Contrast  01/26/2014   CLINICAL DATA:  Coarse injury, fall, laceration above right eye.  EXAM: CT HEAD WITHOUT CONTRAST  CT MAXILLOFACIAL WITHOUT CONTRAST  CT CERVICAL SPINE WITHOUT CONTRAST  TECHNIQUE: Multidetector CT imaging of the head, cervical spine, and maxillofacial structures were performed using the standard protocol without intravenous contrast. Multiplanar CT image reconstructions of the cervical spine and maxillofacial structures were also generated.  COMPARISON:  None.  FINDINGS: CT HEAD FINDINGS  No intracranial hemorrhage. No parenchymal contusion. No  midline shift or mass effect. Basilar cisterns are patent. No skull base fracture. No fluid in the paranasal sinuses or mastoid air cells. Orbits are normal.  CT MAXILLOFACIAL FINDINGS  No evidence of orbital rim fracture. The zygomatic arches are intact. The globes are normal the intraconal contents are normal. There is a small skin laceration over the right orbit. No evidence of maxillary bone fracture. There is no fluid in the maxillary sinuses. The pterygoid plates are normal. The maxillary bone is normal.  The mandibular condyles are located. There is nondisplaced fracture through the angle of the left mandible (image 28, series 302 and image 52, series 308.) This fracture extends from the angle of the mandible anterior to the alveolar ridge.  No injury to the deep soft tissues of face.  CT CERVICAL SPINE FINDINGS  No prevertebral soft tissue swelling. Normal alignment of cervical vertebral bodies. No loss of vertebral body height. Normal facet articulation. Normal craniocervical junction.  No evidence epidural or paraspinal hematoma.  There is a left pneumothorax. Gas dissects into the para spinal musculature on the left.  Danella Deis: 1. No intracranial trauma. 2. Left mandibular fracture extending from the angle the gel to the alveolar ridge. 3. Small laceration over the right orbit. 4. No cervical spine fracture. 5. Left pneumothorax with gas dissecting into the left paraspinal musculature. Findings conveyed toMELANIE BELFI on 01/26/2014  at20:46.   Electronically Signed   By: Roseanne Reno  [id number] CRush Ba<MEASUREMProvidence Hospital North y displaced comminuted fractures through the posterior and lateral left ninth, tenth and eleventh ribs. Minimally displaced fracture of the posterior left twelfth rib. Extensive subcutaneous emphysema within the left chest wall soft tissues. Nondisplaced fracture through the lateral aspect of the right first rib. Nondisplaced fracture through the lateral aspect of the right fourth rib.  IMPRESSION: 1. Multiple markedly comminuted and displaced left posterior and lateral rib fractures. 2. Moderate left pneumothorax.  Small left pleural effusion. 3. Consolidative opacities within the left upper and left lower lobes which may represent a combination of pulmonary contusion and atelectasis. 4. Nondisplaced right first and fourth rib fractures. 5.  Extensive subcutaneous emphysema within left chest wall and dorsal soft tissues. 6. Stool throughout the colon as can be seen with constipation. Critical Value/emergent results were called by telephone at the time of interpretation on 01/26/2014 at 8:51 pm to Dr. [name] , who verbally acknowledged these results.   Electronically Signed   By: Drew  Davis M.D.   On: 01/26/2014 20:53   Ct Cervical Spine Wo Contrast  01/26/2014   CLINICAL DATA:  Coarse injury, fall, laceration above right eye.  EXAM: CT HEAD WITHOUT CONTRAST  CT MAXILLOFACIAL WITHOUT CONTRAST  CT CERVICAL SPINE WITHOUT CONTRAST  TECHNIQUE: Multidetector CT imaging of the head, cervical spine, and maxillofacial structures were performed using the standard protocol without intravenous contrast. Multiplanar CT image reconstructions of the cervical spine and maxillofacial structures were also generated.  COMPARISON:  None.  FINDINGS: CT HEAD FINDINGS  No intracranial hemorrhage. No parenchymal contusion. No midline shift or mass effect. Basilar cisterns are patent. No skull base fracture. No fluid in the paranasal sinuses or mastoid air cells. Orbits are normal.  CT MAXILLOFACIAL FINDINGS  No evidence of orbital rim fracture. The zygomatic arches are intact. The globes are normal the intraconal contents are normal. There is a small skin laceration over the right orbit. No evidence of maxillary bone fracture. There is no fluid in the maxillary sinuses. The pterygoid plates are normal. TheSanKentuckya ng9 [place] J43uan DMCameroonSt Charles HospiSanKentuckya LiRush Barer897 ce CourtC63enterMCameroonVibra Hospital Of NSanKentuckya Ling87Rush BareribeSanKentuckya LiRush Barer[address] boMCameroonMemorSanKentuckya Rush Barerg[address]67ter SanKentuckya LingRush BarerWild ose St.ge60ry CeMCameroonSumma Rehab Hospitaleroth Meet6295MoSanKentuckya Ling88Rush Barerarre Avenuetut52e, InMCameroonAbilene Surgery Centerermh MedicSanKentuckya LiRush Barer[address] 32HospiMCameroonRhode Island HospiSanKentuckya Ling76[address] MarsRush Barerl [address] Clinic H76ealthMCameroonSsm HealLewisburg Plastic Surgery And Laser Centerhabilitation HospitaleSanKenSanKentuckya Ling9842 East GaRush Barerer A .arercres24<MEASMCameSanKentuckya Ling139 ShRush BarerFarm riveedica47l CeMichaele SanKentuckya Ling[address] B[address]<MEASURSanKentuckya Ling7832 N. <MEASUSan79KentuMCameroonBronx Va Medical Centerer4 W. ApRush62952[facility]62952Cabin crewzalee CourJasmine December413e OfInov8 SSanKentuckya Ling8905 East Van DyRush BarerCourt Lucks  is normal.  The mandibular condyles are located. There is nondisplaced fracture through the angle of the left mandible (image 28, series 302 and image 52, series 308.) This fracture extends from the angle of the mandible anterior to the alveolar ridge.  No injury to the deep soft tissues of face.  CT CERVICAL SPINE FINDINGS  No prevertebral soft tissue swelling. Normal alignment of cervical vertebral bodies. No loss of vertebral body height. Normal  facet articulation. Normal craniocervical junction.  No evidence epidural or paraspinal hematoma.  There is a left pneumothorax. Gas dissects into the para spinal musculature on the left.  Danella Deis: 1. No intracranial trauma. 2. Left mandibular fracture extending from the angle the gel to the alveolar ridge. 3. Small laceration over the right orbit. 4. No cervical spine fracture. 5. Left pneumothorax with gas dissecting into the left paraspinal musculature. Findings conveyed toMELANIE BELFI on 01/26/2014  at20:46.   Electronically Signed   By: Genevive Bi M.D.   On: 01/26/2014 20:48   Ct Abdomen Pelvis W Contrast  01/26/2014   CLINICAL DATA:  Patient status post fall horse.  EXAM: CT CHEST, ABDOMEN, AND PELVIS WITH CONTRAST  TECHNIQUE: Multidetector CT imaging of the chest, abdomen and pelvis was performed following the standard protocol during bolus administration of intravenous contrast.  CONTRAST:  OMNIPAQUE IOHEXOL 300 MG/ML  SOLN  COMPARISON:  None.  FINDINGS: CT CHEST FINDINGS  Visualized thyroid is unremarkable. No enlarged axillary, mediastinal or hilar lymphadenopathy. Normal heart size. No pericardial effusion. Aorta and main pulmonary artery normal in caliber.  There is a moderate sized left-sided pneumothorax. Small left-sided pleural effusion. There are ground-glass and consolidative opacities within the collapsed left upper, and left lower lobes. Dependent ground-glass opacities within the right lower lobe, likely atelectasis.  CT ABDOMEN AND PELVIS FINDINGS  There is a 2.9 cm cyst within the posterior right hepatic lobe. There is an additional adjacent 1 cm cyst in the right hepatic lobe. Fatty deposition adjacent to the falciform ligament.  Spleen, pancreas and bilateral adrenal glands are unremarkable. 1 cm simple cyst within the superior pole of the left kidney.  Normal caliber abdominal aorta. No retroperitoneal lymphadenopathy. Urinary bladder is mildly distended. Uterus is  unremarkable.  The stool is present throughout the colon. Additionally there is flocculent material demonstrated within the distal small bowel. Normal appendix.  There is a minimally displaced oblique fracture through the lateral left eighth rib. Markedly displaced comminuted fractures through the posterior and lateral left ninth, tenth and eleventh ribs. Minimally displaced fracture of the posterior left twelfth rib. Extensive subcutaneous emphysema within the left chest wall soft tissues. Nondisplaced fracture through the lateral aspect of the right first rib. Nondisplaced fracture through the lateral aspect of the right fourth rib.  IMPRESSION: 1. Multiple markedly comminuted and displaced left posterior and lateral rib fractures. 2. Moderate left pneumothorax.  Small left pleural effusion. 3. Consolidative opacities within the left upper and left lower lobes which may represent a combination of pulmonary contusion and atelectasis. 4. Nondisplaced right first and fourth rib fractures. 5. Extensive subcutaneous emphysema within left chest wall and dorsal soft tissues. 6. Stool throughout the colon as can be seen with constipation. Critical Value/emergent results were called by telephone at the time of interpretation on 01/26/2014 at 8:51 pm to Dr. Shawna Orleans BELFI , who verbally acknowledged these results.   Electronically Signed   By: Annia Belt M.D.   On: 01/26/2014 20:53   Dg Pelvis Portable  01/26/2014  CLINICAL DATA:  Trauma, fall from horse  EXAM: PORTABLE PELVIS 1-2 VIEWS  COMPARISON:  None.  FINDINGS: Hips are located. No evidence of pelvic fracture or sacral fracture. No evidence of femoral neck fracture on single view.  IMPRESSION: No evidence of pelvic fracture.   Electronically Signed   By: Genevive Bi M.D.   On: 01/26/2014 19:58   Dg Chest Portable 1 View  01/26/2014   CLINICAL DATA:  Pneumothorax following trauma  EXAM: PORTABLE CHEST - 1 VIEW  COMPARISON:  CT 01/26/2014  FINDINGS: Interval  placement left-sided chest to. There is interval expansion of the left lung. The pleural edge is difficult to define at the left lung apex. Posterior left rib fractures are noted. Pulmonary contusion at the left lung base noted.  IMPRESSION: 1. Interval expansion of left lung following chest tube placement. 2. Posterior left rib fractures and pulmonary contusion again noted.   Electronically Signed   By: Genevive Bi M.D.   On: 01/26/2014 21:54   Dg Chest Port 1 View  01/26/2014   CLINICAL DATA:  Trauma.  Fell from horse.  EXAM: PORTABLE CHEST - 1 VIEW  COMPARISON:  None.  FINDINGS: Cardiac silhouette is normal in size. No mediastinal or hilar masses.  There are mildly thickened interstitial markings. Mild hazy opacity projects at the left lung base. This may be in part due to extend circulation of opacity from rotation and overlying soft tissue. No evidence of pulmonary edema. No pneumothorax or pleural effusion.  Probable fracture of the lateral left tenth rib. A small amount of deep soft tissue air is seen adjacent to this. No other convincing fracture.  IMPRESSION: Probable fracture of the lateral left tenth rib supported by a small amount of deep soft tissue air.  Opacity in the left lung base is likely atelectasis in combination with chronic interstitial thickening. Lung contusion is possible.  No pleural effusion or pneumothorax.  No other acute findings.   Electronically Signed   By: Amie Portland M.D.   On: 01/26/2014 19:59   Ct Maxillofacial Wo Cm  01/26/2014   CLINICAL DATA:  Coarse injury, fall, laceration above right eye.  EXAM: CT HEAD WITHOUT CONTRAST  CT MAXILLOFACIAL WITHOUT CONTRAST  CT CERVICAL SPINE WITHOUT CONTRAST  TECHNIQUE: Multidetector CT imaging of the head, cervical spine, and maxillofacial structures were performed using the standard protocol without intravenous contrast. Multiplanar CT image reconstructions of the cervical spine and maxillofacial structures were also  generated.  COMPARISON:  None.  FINDINGS: CT HEAD FINDINGS  No intracranial hemorrhage. No parenchymal contusion. No midline shift or mass effect. Basilar cisterns are patent. No skull base fracture. No fluid in the paranasal sinuses or mastoid air cells. Orbits are normal.  CT MAXILLOFACIAL FINDINGS  No evidence of orbital rim fracture. The zygomatic arches are intact. The globes are normal the intraconal contents are normal. There is a small skin laceration over the right orbit. No evidence of maxillary bone fracture. There is no fluid in the maxillary sinuses. The pterygoid plates are normal. The maxillary bone is normal.  The mandibular condyles are located. There is nondisplaced fracture through the angle of the left mandible (image 28, series 302 and image 52, series 308.) This fracture extends from the angle of the mandible anterior to the alveolar ridge.  No injury to the deep soft tissues of face.  CT CERVICAL SPINE FINDINGS  No prevertebral soft tissue swelling. Normal alignment of cervical vertebral bodies. No loss of vertebral body height. Normal  facet articulation. Normal craniocervical junction.  No evidence epidural or paraspinal hematoma.  There is a left pneumothorax. Gas dissects into the para spinal musculature on the left.  Danella Deis: 1. No intracranial trauma. 2. Left mandibular fracture extending from the angle the gel to the alveolar ridge. 3. Small laceration over the right orbit. 4. No cervical spine fracture. 5. Left pneumothorax with gas dissecting into the left paraspinal musculature. Findings conveyed toMELANIE BELFI on 01/26/2014  at20:46.   Electronically Signed   By: Genevive Bi M.D.   On: 01/26/2014 20:48    Anti-infectives: Anti-infectives   Start     Dose/Rate Route Frequency Ordered Stop   01/27/14 1800  ceFAZolin (ANCEF) IVPB 1 g/50 mL premix     1 g 100 mL/hr over 30 Minutes Intravenous Every 8 hours 01/27/14 1719     01/26/14 2115  clindamycin (CLEOCIN) IVPB 600  mg     600 mg 100 mL/hr over 30 Minutes Intravenous  Once 01/26/14 2101 01/26/14 2209      Assessment/Plan: s/p Horse trample Broken ribs with rib fractures Mandible fracture without displacement  CXR now  LOS: 1 day   Marta Lamas. Gae Bon, MD, FACS [phone] Trauma Surgeon 01/27/2014

## 2014-01-28 ENCOUNTER — Inpatient Hospital Stay (HOSPITAL_COMMUNITY): Payer: Managed Care, Other (non HMO)

## 2014-01-28 LAB — CBC
HEMATOCRIT: 32.3 % — AB (ref 36.0–46.0)
Hemoglobin: 10.9 g/dL — ABNORMAL LOW (ref 12.0–15.0)
MCH: 30.1 pg (ref 26.0–34.0)
MCHC: 33.7 g/dL (ref 30.0–36.0)
MCV: 89.2 fL (ref 78.0–100.0)
Platelets: 214 10*3/uL (ref 150–400)
RBC: 3.62 MIL/uL — ABNORMAL LOW (ref 3.87–5.11)
RDW: 13 % (ref 11.5–15.5)
WBC: 10.5 10*3/uL (ref 4.0–10.5)

## 2014-01-28 LAB — BASIC METABOLIC PANEL
Anion gap: 13 (ref 5–15)
BUN: 8 mg/dL (ref 6–23)
CHLORIDE: 103 meq/L (ref 96–112)
CO2: 24 mEq/L (ref 19–32)
CREATININE: 0.66 mg/dL (ref 0.50–1.10)
Calcium: 8.4 mg/dL (ref 8.4–10.5)
GFR calc non Af Amer: >90 mL/min (ref >90)
GLUCOSE: 112 mg/dL — AB (ref 70–99)
Potassium: 4.1 mEq/L (ref 3.7–5.3)
Sodium: 140 mEq/L (ref 137–147)

## 2014-01-28 MED ORDER — TIZANIDINE HCL 4 MG PO TABS
4.0000 mg | ORAL_TABLET | Freq: Three times a day (TID) | ORAL | Status: DC | PRN
  Filled 2014-01-28: qty 1

## 2014-01-28 MED ORDER — IPRATROPIUM-ALBUTEROL 0.5-2.5 (3) MG/3ML IN SOLN: 3.0000 mL | Freq: Four times a day (QID) | RESPIRATORY_TRACT | Status: DC | PRN

## 2014-01-28 NOTE — Progress Notes (Signed)
Pt transferred to 6NT. Report given via phone and updates at bedside to [facility].  Pt alert and oriented x4, pain medication given prior to to transfer.  Daughter at bedside, both pt and daughter state all belongings are with them for the duration of transfer.

## 2014-01-28 NOTE — Progress Notes (Signed)
UR completed.  Kenlie Seki, RN BSN MHA CCM Trauma/Neuro ICU Case Manager [phone]  

## 2014-01-28 NOTE — Progress Notes (Signed)
Patient refused to go down to [facility] arthopantogram, ask staff if she can do it in AM.

## 2014-01-28 NOTE — Progress Notes (Signed)
Patient ID: Andrea Shaw, female   DOB: 30-May-1955, 58 y.o.   MRN: [id number]    Subjective: B ribs sore, not doing well with IS  Objective: Vital signs in last 24 hours: Temp:  [97.8 F (36.6 C)-99.4 F (37.4 C)] 99 F (37.2 C) (09/21 0739) Pulse Rate:  [72-83] 83 (09/21 0900) Resp:  [16-32] 32 (09/21 0900) BP: (90-115)/(53-72) 114/65 mmHg (09/21 0900) SpO2:  [56 %-99 %] 98 % (09/21 0900)    Intake/Output from previous day: 09/20 0701 - 09/21 0700 In: 3041.3 [I.V.:2441.3; IV Piggyback:600] Out: 70 [Urine:50; Chest Tube:20] Intake/Output this shift: Total I/O In: 75 [I.V.:75] Out: 5 [Chest Tube:5]  General appearance: alert and cooperative Head: chin lacs Resp: clear to auscultation bilaterally Chest wall: right sided chest wall tenderness, left sided chest wall tenderness Cardio: regular rate and rhythm GI: soft, NT, ND IS: 250cc  Lab Results: CBC   Recent Labs  01/26/14 1954 01/27/14 0500  WBC 21.1* 9.7  HGB 13.7 11.3*  HCT 39.8 33.5*  PLT 291 271   BMET  Recent Labs  01/26/14 1954 01/27/14 0500  NA 138 139  K 3.5* 5.4*  CL 101 105  CO2 24 22  GLUCOSE 155* 150*  BUN 21 17  CREATININE 0.88 0.69  CALCIUM 9.6 8.5   PT/INR  Recent Labs  01/26/14 1954  LABPROT 13.2  INR 1.00    Anti-infectives: Anti-infectives   Start     Dose/Rate Route Frequency Ordered Stop   01/27/14 1800  ceFAZolin (ANCEF) IVPB 1 g/50 mL premix     1 g 100 mL/hr over 30 Minutes Intravenous Every 8 hours 01/27/14 1719     01/26/14 2115  clindamycin (CLEOCIN) IVPB 600 mg     600 mg 100 mL/hr over 30 Minutes Intravenous  Once 01/26/14 2101 01/26/14 2209      Assessment/Plan: Stepped on by horse R rib FX 1,4/L rib Fx 8-12 with PTX - CT to water seal today, needs better pulmonary toilet, BDs PRN, I strongly encouraged IS Mandible FX, chin lac - panorex, ABX, non-chew diet per Dr. Chales Salmon FEN - advance to fulls, decrease IVF VTE - PAS, check labs this AM prior to  starting Lovenox Dispo - floor   LOS: 2 days    Violeta Gelinas, MD, MPH, FACS Trauma: [phone] General Surgery: [phone]  01/28/2014

## 2014-01-28 NOTE — Evaluation (Signed)
Physical Therapy Evaluation Patient Details Name: Andrea Shaw MRN: [id number] DOB: 1956-04-14 Today's Date: 01/28/2014   History of Present Illness  58 y.o. female admitted to [facility] on 01/26/14 after being kicked by a horse.  She had resultant multiple bil rib fxs (L side more than right), L PTX with L chest tube and mandible fx with multiple lacerations on her chin.  Pt with no significant PMHx.   Clinical Impression  Pt is mobilizing slowly due to fear and anxiety related to producing pain.  Did better with pre medication and with step by step instructions.  Her O2 sats did drop during mobility on RA and required 2 L O2 to be returned during our session.  She reports she is only getting 500 mL on her IS.  PT will follow acutely, but I anticipate she will mobilize well enough to go home with family's supervision and no PT f/u at discharge.   PT to follow acutely for deficits listed below.       Follow Up Recommendations Supervision for mobility/OOB;No PT follow up    Equipment Recommendations  None recommended by PT    Recommendations for Other Services   NA    Precautions / Restrictions Precautions Precautions: Other (comment) Precaution Comments: monitor O2 sats with mobility      Mobility  Bed Mobility Overal bed mobility: Needs Assistance Bed Mobility: Rolling;Sidelying to Sit Rolling: Min assist Sidelying to sit: Min assist       General bed mobility comments: Min assist and verbal cues to preform the log roll technique to the right side using bed rail for leverage.  Pt needed step by step instructions for technique and to help with her anxiety associated with movement. Min assist to lift her trunk during transition to sitting EOB.   Transfers Overall transfer level: Needs assistance Equipment used: 1 person hand held assist Transfers: Sit to/from [employer] Sit to Stand: Min guard Stand pivot transfers: Min guard       General transfer comment:  Min guard assist for safety as pt felt lightheaded and nauseated sitting EOB.  Min guard assist for transfer for safety as it was the first time on her feet.   Ambulation/Gait Ambulation/Gait assistance: Supervision Ambulation Distance (Feet): 8 Feet Assistive device: Rolling walker (2 wheeled) Gait Pattern/deviations: Step-through pattern;Shuffle Gait velocity: decreased   General Gait Details: Pt with slow, guarded gait.  Used RW for increased confidence.       Balance Overall balance assessment: Needs assistance Sitting-balance support: Feet supported;No upper extremity supported Sitting balance-Leahy Scale: Good     Standing balance support: Bilateral upper extremity supported Standing balance-Leahy Scale: Poor Standing balance comment: pt needs external support at this time for balance.                              Pertinent Vitals/Pain Pain Assessment: 0-10 Pain Score: 5  Pain Location: bil ribs Pain Descriptors / Indicators: Aching;Burning Pain Intervention(s): Limited activity within patient's tolerance;Monitored during session;Premedicated before session;Repositioned    Home Living Family/patient expects to be discharged to:: Private residence Living Arrangements: Spouse/significant other Available Help at Discharge: Family;Available 24 hours/day (if needed, husband works, but can take off. ) Type of Home: House Home Access: Stairs to enter Entrance Stairs-Rails: Right;Left Entrance Stairs-Number of Steps: 2 Home Layout: Two level;Full bath on main level;Able to live on main level with bedroom/bathroom Home Equipment: None      Prior  Function Level of Independence: Independent         Comments: drives, works for the city of [employer]        Extremity/Trunk Assessment   Upper Extremity Assessment: Defer to OT evaluation           Lower Extremity Assessment: Generalized weakness      Cervical / Trunk Assessment: Normal   Communication   Communication: No difficulties  Cognition Arousal/Alertness: Awake/alert Behavior During Therapy: Anxious Overall Cognitive Status: Within Functional Limits for tasks assessed                      General Comments General comments (skin integrity, edema, etc.): Educated pt re: bracing for pain with a pillow or a "self hug" if she doesn't have a pillow, especially if she is anticipating a cough, sneeze or laughing.           Assessment/Plan    PT Assessment Patient needs continued PT services  PT Diagnosis Difficulty walking;Abnormality of gait;Generalized weakness;Acute pain   PT Problem List Decreased strength;Decreased activity tolerance;Decreased balance;Decreased mobility;Decreased knowledge of use of DME;Cardiopulmonary status limiting activity;Pain  PT Treatment Interventions DME instruction;Gait training;Stair training;Functional mobility training;Therapeutic activities;Therapeutic exercise;Balance training;Neuromuscular re-education;Patient/family education;Modalities   PT Goals (Current goals can be found in the Care Plan section) Acute Rehab PT Goals Patient Stated Goal: to decrease her pain PT Goal Formulation: With patient Time For Goal Achievement: 02/11/14 Potential to Achieve Goals: Good    Frequency Min 3X/week    End of Session Equipment Utilized During Treatment: Oxygen Activity Tolerance: Patient limited by pain Patient left: in chair;with call bell/phone within reach           Time: 1656-1755 PT Time Calculation (min): 59 min   Charges:   PT Evaluation $Initial PT Evaluation Tier I: 1 Procedure (subtracted 12 mins for toileting) PT Treatments $Therapeutic Activity: 23-37 mins        Antha Niday B. Anaiah Mcmannis, PT, DPT [phone]   01/28/2014, 5:58 PM

## 2014-01-28 NOTE — Progress Notes (Signed)
Noted patient CT drainage increase and noted a dry blood on patient skin along the CT line. Patient is alert and oriented, up in a chair assisted by PT, patient not in any distress O2 sat 98% on oxygen. CT intact, no leaking noted. Will continue to monitor.

## 2014-01-29 ENCOUNTER — Encounter (HOSPITAL_COMMUNITY): Payer: Managed Care, Other (non HMO) | Admitting: Anesthesiology

## 2014-01-29 ENCOUNTER — Encounter (HOSPITAL_COMMUNITY): Payer: Self-pay | Admitting: Anesthesiology

## 2014-01-29 ENCOUNTER — Inpatient Hospital Stay (HOSPITAL_COMMUNITY): Payer: Managed Care, Other (non HMO) | Admitting: Anesthesiology

## 2014-01-29 ENCOUNTER — Inpatient Hospital Stay (HOSPITAL_COMMUNITY): Payer: Managed Care, Other (non HMO)

## 2014-01-29 ENCOUNTER — Encounter (HOSPITAL_COMMUNITY)
Admission: EM | Disposition: A | Discharge status: Home / Self Care | Payer: Managed Care, Other (non HMO) | Source: Home / Self Care

## 2014-01-29 DIAGNOSIS — S0181XA Laceration without foreign body of other part of head, initial encounter: Secondary | ICD-10-CM | POA: Diagnosis present

## 2014-01-29 DIAGNOSIS — S271XXA Traumatic hemothorax, initial encounter: Secondary | ICD-10-CM

## 2014-01-29 DIAGNOSIS — S02609A Fracture of mandible, unspecified, initial encounter for closed fracture: Secondary | ICD-10-CM | POA: Diagnosis present

## 2014-01-29 DIAGNOSIS — S2243XA Multiple fractures of ribs, bilateral, initial encounter for closed fracture: Secondary | ICD-10-CM | POA: Diagnosis present

## 2014-01-29 DIAGNOSIS — D62 Acute posthemorrhagic anemia: Secondary | ICD-10-CM | POA: Diagnosis not present

## 2014-01-29 HISTORY — PX: FLEXIBLE BRONCHOSCOPY: SHX5094

## 2014-01-29 HISTORY — PX: THORACOTOMY: SHX5074

## 2014-01-29 LAB — CBC WITH DIFFERENTIAL/PLATELET
BASOS ABS: 0 10*3/uL (ref 0.0–0.1)
BASOS PCT: 0 % (ref 0–1)
Basophils Absolute: 0 10*3/uL (ref 0.0–0.1)
Basophils Relative: 0 % (ref 0–1)
EOS ABS: 0 10*3/uL (ref 0.0–0.7)
EOS ABS: 0 10*3/uL (ref 0.0–0.7)
EOS PCT: 0 % (ref 0–5)
Eosinophils Relative: 0 % (ref 0–5)
HCT: 31.2 % — ABNORMAL LOW (ref 36.0–46.0)
HEMATOCRIT: 25 % — AB (ref 36.0–46.0)
Hemoglobin: 8.3 g/dL — ABNORMAL LOW (ref 12.0–15.0)
Hemoglobin: 10.8 g/dL — ABNORMAL LOW (ref 12.0–15.0)
LYMPHS PCT: 7 % — AB (ref 12–46)
Lymphocytes Relative: 9 % — ABNORMAL LOW (ref 12–46)
Lymphs Abs: 1.9 10*3/uL (ref 0.7–4.0)
Lymphs Abs: 0.9 10*3/uL (ref 0.7–4.0)
MCH: 30.6 pg (ref 26.0–34.0)
MCH: 29.3 pg (ref 26.0–34.0)
MCHC: 33.2 g/dL (ref 30.0–36.0)
MCHC: 34.6 g/dL (ref 30.0–36.0)
MCV: 92.3 fL (ref 78.0–100.0)
MCV: 84.6 fL (ref 78.0–100.0)
MONO ABS: 2.5 10*3/uL — AB (ref 0.1–1.0)
MONO ABS: 1.2 10*3/uL — AB (ref 0.1–1.0)
MONOS PCT: 11 % (ref 3–12)
Monocytes Relative: 10 % (ref 3–12)
Neutro Abs: 17.9 10*3/uL — ABNORMAL HIGH (ref 1.7–7.7)
Neutro Abs: 10.2 10*3/uL — ABNORMAL HIGH (ref 1.7–7.7)
Neutrophils Relative %: 80 % — ABNORMAL HIGH (ref 43–77)
Neutrophils Relative %: 83 % — ABNORMAL HIGH (ref 43–77)
PLATELETS: 159 10*3/uL (ref 150–400)
Platelets: 269 10*3/uL (ref 150–400)
RBC: 2.71 MIL/uL — ABNORMAL LOW (ref 3.87–5.11)
RBC: 3.69 MIL/uL — ABNORMAL LOW (ref 3.87–5.11)
RDW: 12.8 % (ref 11.5–15.5)
RDW: 15.8 % — AB (ref 11.5–15.5)
WBC: 22.4 10*3/uL — ABNORMAL HIGH (ref 4.0–10.5)
WBC: 12.3 10*3/uL — AB (ref 4.0–10.5)

## 2014-01-29 LAB — POCT I-STAT 7, (LYTES, BLD GAS, ICA,H+H)
Acid-base deficit: 4 mmol/L — ABNORMAL HIGH (ref 0.0–2.0)
Bicarbonate: 22.7 mEq/L (ref 20.0–24.0)
Calcium, Ion: 1.17 mmol/L (ref 1.12–1.23)
HEMATOCRIT: 23 % — AB (ref 36.0–46.0)
HEMOGLOBIN: 7.8 g/dL — AB (ref 12.0–15.0)
O2 Saturation: 93 %
PO2 ART: 68 mmHg — AB (ref 80.0–100.0)
Patient temperature: 36
Potassium: 3.9 meq/L (ref 3.7–5.3)
Sodium: 134 meq/L — ABNORMAL LOW (ref 137–147)
TCO2: 24 mmol/L (ref 0–100)
pCO2 arterial: 44.4 mmHg (ref 35.0–45.0)
pH, Arterial: 7.312 — ABNORMAL LOW (ref 7.350–7.450)

## 2014-01-29 LAB — CBC
HEMATOCRIT: 31.6 % — AB (ref 36.0–46.0)
Hemoglobin: 10.7 g/dL — ABNORMAL LOW (ref 12.0–15.0)
MCH: 30.4 pg (ref 26.0–34.0)
MCHC: 33.9 g/dL (ref 30.0–36.0)
MCV: 89.8 fL (ref 78.0–100.0)
Platelets: 200 10*3/uL (ref 150–400)
RBC: 3.52 MIL/uL — ABNORMAL LOW (ref 3.87–5.11)
RDW: 12.9 % (ref 11.5–15.5)
WBC: 10 10*3/uL (ref 4.0–10.5)

## 2014-01-29 LAB — PREPARE RBC (CROSSMATCH)

## 2014-01-29 LAB — ABO/RH: ABO/RH(D): O POS

## 2014-01-29 LAB — PROTIME-INR
INR: 1.18 (ref 0.00–1.49)
Prothrombin Time: 15 seconds (ref 11.6–15.2)

## 2014-01-29 SURGERY — THORACOTOMY, MAJOR
Anesthesia: General | Site: Chest

## 2014-01-29 MED ORDER — ONDANSETRON HCL 4 MG/2ML IJ SOLN
4.0000 mg | Freq: Four times a day (QID) | INTRAMUSCULAR | Status: DC | PRN
  Administered 2014-01-30 – 2014-01-31 (×2): 4 mg via INTRAVENOUS

## 2014-01-29 MED ORDER — NEOSTIGMINE METHYLSULFATE 10 MG/10ML IV SOLN
INTRAVENOUS | Status: AC
  Filled 2014-01-29: qty 1

## 2014-01-29 MED ORDER — FENTANYL CITRATE 0.05 MG/ML IJ SOLN
INTRAMUSCULAR | Status: DC | PRN
  Administered 2014-01-29: 50 ug via INTRAVENOUS
  Administered 2014-01-29: 125 ug via INTRAVENOUS

## 2014-01-29 MED ORDER — LACTATED RINGERS IV SOLN
INTRAVENOUS | Status: DC | PRN
  Administered 2014-01-29: 16:00:00 via INTRAVENOUS

## 2014-01-29 MED ORDER — ACETAMINOPHEN 160 MG/5ML PO SOLN
1000.0000 mg | Freq: Four times a day (QID) | ORAL | Status: DC
  Filled 2014-01-29: qty 40

## 2014-01-29 MED ORDER — KCL IN DEXTROSE-NACL 20-5-0.45 MEQ/L-%-% IV SOLN
INTRAVENOUS | Status: DC
  Administered 2014-01-29: 22:00:00 via INTRAVENOUS
  Filled 2014-01-29 (×4): qty 1000

## 2014-01-29 MED ORDER — VANCOMYCIN HCL IN DEXTROSE 1-5 GM/200ML-% IV SOLN
1000.0000 mg | Freq: Once | INTRAVENOUS | Status: AC
  Administered 2014-01-29: 1000 mg via INTRAVENOUS
  Filled 2014-01-29: qty 200

## 2014-01-29 MED ORDER — PROPOFOL 10 MG/ML IV BOLUS
INTRAVENOUS | Status: DC | PRN
  Administered 2014-01-29: 150 mg via INTRAVENOUS

## 2014-01-29 MED ORDER — SODIUM CHLORIDE 0.9 % IV BOLUS (SEPSIS)
1000.0000 mL | Freq: Once | INTRAVENOUS | Status: AC
  Administered 2014-01-29: 1000 mL via INTRAVENOUS

## 2014-01-29 MED ORDER — HYDROMORPHONE HCL 1 MG/ML IJ SOLN: 0.5000 mg | INTRAMUSCULAR | Status: DC | PRN

## 2014-01-29 MED ORDER — SENNOSIDES-DOCUSATE SODIUM 8.6-50 MG PO TABS
1.0000 | ORAL_TABLET | Freq: Every day | ORAL | Status: DC
  Administered 2014-01-30 – 2014-02-02 (×4): 1 via ORAL
  Filled 2014-01-29 (×7): qty 1

## 2014-01-29 MED ORDER — MIDAZOLAM HCL 2 MG/2ML IJ SOLN
INTRAMUSCULAR | Status: AC
  Filled 2014-01-29: qty 2

## 2014-01-29 MED ORDER — CEPHALEXIN 250 MG PO CAPS
250.0000 mg | ORAL_CAPSULE | Freq: Three times a day (TID) | ORAL | Status: DC
  Administered 2014-01-29 (×2): 250 mg via ORAL
  Filled 2014-01-29 (×6): qty 1

## 2014-01-29 MED ORDER — GLYCOPYRROLATE 0.2 MG/ML IJ SOLN
INTRAMUSCULAR | Status: AC
  Filled 2014-01-29: qty 3

## 2014-01-29 MED ORDER — DIPHENHYDRAMINE HCL 12.5 MG/5ML PO ELIX
12.5000 mg | ORAL_SOLUTION | Freq: Four times a day (QID) | ORAL | Status: DC | PRN
  Filled 2014-01-29: qty 5

## 2014-01-29 MED ORDER — POTASSIUM CHLORIDE 10 MEQ/50ML IV SOLN
10.0000 meq | Freq: Every day | INTRAVENOUS | Status: DC | PRN
  Filled 2014-01-29: qty 50

## 2014-01-29 MED ORDER — PHENYLEPHRINE HCL 10 MG/ML IJ SOLN
INTRAMUSCULAR | Status: DC | PRN
  Administered 2014-01-29 (×4): 80 ug via INTRAVENOUS

## 2014-01-29 MED ORDER — ONDANSETRON HCL 4 MG/2ML IJ SOLN: 4.0000 mg | Freq: Once | INTRAMUSCULAR | Status: DC | PRN

## 2014-01-29 MED ORDER — SODIUM CHLORIDE 0.9 % IJ SOLN: 9.0000 mL | INTRAMUSCULAR | Status: DC | PRN

## 2014-01-29 MED ORDER — ONDANSETRON HCL 4 MG/2ML IJ SOLN
4.0000 mg | Freq: Four times a day (QID) | INTRAMUSCULAR | Status: DC | PRN
  Filled 2014-01-29 (×2): qty 2

## 2014-01-29 MED ORDER — BISACODYL 5 MG PO TBEC
10.0000 mg | DELAYED_RELEASE_TABLET | Freq: Every day | ORAL | Status: DC
  Administered 2014-01-29 – 2014-02-01 (×4): 10 mg via ORAL
  Filled 2014-01-29 (×6): qty 2

## 2014-01-29 MED ORDER — 0.9 % SODIUM CHLORIDE (POUR BTL) OPTIME
TOPICAL | Status: DC | PRN
  Administered 2014-01-29: 2000 mL

## 2014-01-29 MED ORDER — EPHEDRINE SULFATE 50 MG/ML IJ SOLN
INTRAMUSCULAR | Status: DC | PRN
  Administered 2014-01-29: 10 mg via INTRAVENOUS

## 2014-01-29 MED ORDER — ONDANSETRON HCL 4 MG/2ML IJ SOLN
INTRAMUSCULAR | Status: DC | PRN
  Administered 2014-01-29: 4 mg via INTRAVENOUS

## 2014-01-29 MED ORDER — DEXTROSE 5 % IV SOLN
1.5000 g | Freq: Three times a day (TID) | INTRAVENOUS | Status: DC
  Administered 2014-01-29: 1.5 g via INTRAVENOUS
  Filled 2014-01-29 (×2): qty 1.5

## 2014-01-29 MED ORDER — ACETAMINOPHEN 500 MG PO TABS
1000.0000 mg | ORAL_TABLET | Freq: Four times a day (QID) | ORAL | Status: DC
  Administered 2014-01-30 – 2014-02-03 (×12): 1000 mg via ORAL
  Administered 2014-02-03: 500 mg via ORAL
  Filled 2014-01-29 (×28): qty 2

## 2014-01-29 MED ORDER — POLYETHYLENE GLYCOL 3350 17 G PO PACK
17.0000 g | PACK | Freq: Every day | ORAL | Status: DC
  Administered 2014-01-29: 17 g via ORAL
  Filled 2014-01-29: qty 1

## 2014-01-29 MED ORDER — NAPROXEN 500 MG PO TABS
500.0000 mg | ORAL_TABLET | Freq: Two times a day (BID) | ORAL | Status: DC
  Administered 2014-01-29: 500 mg via ORAL
  Filled 2014-01-29: qty 2
  Filled 2014-01-29 (×3): qty 1

## 2014-01-29 MED ORDER — SODIUM CHLORIDE 0.9 % IV SOLN
INTRAVENOUS | Status: DC
  Filled 2014-01-29 (×4): qty 1000

## 2014-01-29 MED ORDER — VANCOMYCIN HCL IN DEXTROSE 1-5 GM/200ML-% IV SOLN
1000.0000 mg | Freq: Two times a day (BID) | INTRAVENOUS | Status: AC
  Administered 2014-01-30: 1000 mg via INTRAVENOUS
  Filled 2014-01-29: qty 200

## 2014-01-29 MED ORDER — SODIUM CHLORIDE 0.9 % IV SOLN
INTRAVENOUS | Status: DC
  Administered 2014-01-29 (×2): via INTRAVENOUS

## 2014-01-29 MED ORDER — LIDOCAINE HCL (CARDIAC) 20 MG/ML IV SOLN
INTRAVENOUS | Status: DC | PRN
  Administered 2014-01-29: 80 mg via INTRAVENOUS

## 2014-01-29 MED ORDER — GLYCOPYRROLATE 0.2 MG/ML IJ SOLN
INTRAMUSCULAR | Status: DC | PRN
  Administered 2014-01-29: 0.6 mg via INTRAVENOUS

## 2014-01-29 MED ORDER — DOCUSATE SODIUM 100 MG PO CAPS
100.0000 mg | ORAL_CAPSULE | Freq: Two times a day (BID) | ORAL | Status: DC
  Administered 2014-01-29: 100 mg via ORAL
  Filled 2014-01-29: qty 1

## 2014-01-29 MED ORDER — FENTANYL CITRATE 0.05 MG/ML IJ SOLN
INTRAMUSCULAR | Status: AC
  Filled 2014-01-29: qty 5

## 2014-01-29 MED ORDER — TRAMADOL HCL 50 MG PO TABS: 50.0000 mg | ORAL_TABLET | Freq: Four times a day (QID) | ORAL | Status: DC | PRN

## 2014-01-29 MED ORDER — OXYCODONE HCL 5 MG PO TABS
5.0000 mg | ORAL_TABLET | ORAL | Status: DC | PRN
  Administered 2014-02-01 – 2014-02-02 (×7): 10 mg via ORAL
  Administered 2014-02-03 (×3): 5 mg via ORAL
  Administered 2014-02-03: 10 mg via ORAL
  Filled 2014-01-29 (×4): qty 2
  Filled 2014-01-29: qty 1
  Filled 2014-01-29 (×2): qty 2
  Filled 2014-01-29 (×2): qty 1
  Filled 2014-01-29 (×2): qty 2

## 2014-01-29 MED ORDER — ROCURONIUM BROMIDE 100 MG/10ML IV SOLN
INTRAVENOUS | Status: DC | PRN
  Administered 2014-01-29: 50 mg via INTRAVENOUS
  Administered 2014-01-29: 30 mg via INTRAVENOUS

## 2014-01-29 MED ORDER — HYDROMORPHONE HCL 1 MG/ML IJ SOLN: 0.2500 mg | INTRAMUSCULAR | Status: DC | PRN

## 2014-01-29 MED ORDER — NEOSTIGMINE METHYLSULFATE 10 MG/10ML IV SOLN
INTRAVENOUS | Status: DC | PRN
  Administered 2014-01-29: 4 mg via INTRAVENOUS

## 2014-01-29 MED ORDER — PHENYLEPHRINE HCL 10 MG/ML IJ SOLN
10.0000 mg | INTRAVENOUS | Status: DC | PRN
  Administered 2014-01-29: 25 ug/min via INTRAVENOUS

## 2014-01-29 MED ORDER — NALOXONE HCL 0.4 MG/ML IJ SOLN: 0.4000 mg | INTRAMUSCULAR | Status: DC | PRN

## 2014-01-29 MED ORDER — SODIUM CHLORIDE 0.9 % IV SOLN: Freq: Once | INTRAVENOUS | Status: DC

## 2014-01-29 MED ORDER — DIPHENHYDRAMINE HCL 50 MG/ML IJ SOLN: 12.5000 mg | Freq: Four times a day (QID) | INTRAMUSCULAR | Status: DC | PRN

## 2014-01-29 MED ORDER — OXYCODONE HCL 5 MG PO TABS
10.0000 mg | ORAL_TABLET | ORAL | Status: DC | PRN
  Administered 2014-01-29: 20 mg via ORAL
  Filled 2014-01-29: qty 4

## 2014-01-29 MED ORDER — FENTANYL 10 MCG/ML IV SOLN
INTRAVENOUS | Status: DC
  Administered 2014-01-29 – 2014-01-30 (×2): via INTRAVENOUS
  Administered 2014-01-30: 75 ug via INTRAVENOUS
  Administered 2014-01-30: 150 ug via INTRAVENOUS
  Administered 2014-01-30: 105 ug via INTRAVENOUS
  Administered 2014-01-30: 225 ug via INTRAVENOUS
  Administered 2014-01-30: 90 ug via INTRAVENOUS
  Administered 2014-01-30: 150 ug via INTRAVENOUS
  Administered 2014-01-31: 90 ug via INTRAVENOUS
  Administered 2014-01-31: 75 ug via INTRAVENOUS
  Administered 2014-01-31: 60 ug via INTRAVENOUS
  Administered 2014-01-31: 50 ug via INTRAVENOUS
  Administered 2014-01-31: 60 ug via INTRAVENOUS
  Administered 2014-01-31: 105 ug/h via INTRAVENOUS
  Administered 2014-02-01: 90 ug via INTRAVENOUS
  Administered 2014-02-01: 58.86 ug via INTRAVENOUS
  Administered 2014-02-01: 90 ug via INTRAVENOUS
  Filled 2014-01-29 (×4): qty 50

## 2014-01-29 MED ORDER — LIDOCAINE HCL 4 % MT SOLN
OROMUCOSAL | Status: DC | PRN
  Administered 2014-01-29: 4 mL via TOPICAL

## 2014-01-29 SURGICAL SUPPLY — 60 items
ADH SKN CLS APL DERMABOND .7 (GAUZE/BANDAGES/DRESSINGS) ×2
BLADE SURG 11 STRL SS (BLADE) IMPLANT
CANISTER SUCTION 2500CC (MISCELLANEOUS) ×6 IMPLANT
CATH KIT ON Q 5IN SLV (PAIN MANAGEMENT) IMPLANT
CATH ROBINSON RED A/P 22FR (CATHETERS) IMPLANT
CATH THORACIC 28FR (CATHETERS) ×1 IMPLANT
CATH THORACIC 36FR (CATHETERS) IMPLANT
CATH THORACIC 36FR RT ANG (CATHETERS) IMPLANT
CLIP TI MEDIUM 24 (CLIP) ×1 IMPLANT
CONT SPEC 4OZ CLIKSEAL STRL BL (MISCELLANEOUS) ×6 IMPLANT
DERMABOND ADVANCED (GAUZE/BANDAGES/DRESSINGS) ×1
DERMABOND ADVANCED .7 DNX12 (GAUZE/BANDAGES/DRESSINGS) IMPLANT
DRAIN CHANNEL 32F RND 10.7 FF (WOUND CARE) ×1 IMPLANT
DRAPE LAPAROSCOPIC ABDOMINAL (DRAPES) ×3 IMPLANT
DRAPE WARM FLUID 44X44 (DRAPE) ×3 IMPLANT
DRILL BIT 7/64X5 (BIT) IMPLANT
ELECT BLADE 4.0 EZ CLEAN MEGAD (MISCELLANEOUS) ×3
ELECT REM PT RETURN 9FT ADLT (ELECTROSURGICAL) ×3
ELECTRODE BLDE 4.0 EZ CLN MEGD (MISCELLANEOUS) ×2 IMPLANT
ELECTRODE REM PT RTRN 9FT ADLT (ELECTROSURGICAL) ×2 IMPLANT
GAUZE SPONGE 4X4 12PLY STRL (GAUZE/BANDAGES/DRESSINGS) ×3 IMPLANT
GLOVE BIO SURGEON STRL SZ 6.5 (GLOVE) ×6 IMPLANT
GOWN STRL REUS W/ TWL LRG LVL3 (GOWN DISPOSABLE) ×8 IMPLANT
GOWN STRL REUS W/TWL LRG LVL3 (GOWN DISPOSABLE) ×12
KIT BASIN OR (CUSTOM PROCEDURE TRAY) ×3 IMPLANT
KIT ROOM TURNOVER OR (KITS) ×3 IMPLANT
NS IRRIG 1000ML POUR BTL (IV SOLUTION) ×12 IMPLANT
PACK CHEST (CUSTOM PROCEDURE TRAY) ×3 IMPLANT
PAD ARMBOARD 7.5X6 YLW CONV (MISCELLANEOUS) ×6 IMPLANT
PASSER SUT SWANSON 36MM LOOP (INSTRUMENTS) IMPLANT
SCISSORS LAP 5X35 DISP (ENDOMECHANICALS) IMPLANT
SOLUTION ANTI FOG 6CC (MISCELLANEOUS) ×3 IMPLANT
SPECIMEN JAR LG PLASTIC EMPTY (MISCELLANEOUS) IMPLANT
SPECIMEN JAR MEDIUM (MISCELLANEOUS) IMPLANT
SPONGE GAUZE 4X4 12PLY STER LF (GAUZE/BANDAGES/DRESSINGS) ×1 IMPLANT
SPONGE LAP 18X18 X RAY DECT (DISPOSABLE) ×3 IMPLANT
SUT PROLENE 3 0 SH DA (SUTURE) IMPLANT
SUT PROLENE 4 0 RB 1 (SUTURE)
SUT PROLENE 4-0 RB1 .5 CRCL 36 (SUTURE) IMPLANT
SUT SILK  1 MH (SUTURE) ×2
SUT SILK 1 MH (SUTURE) ×4 IMPLANT
SUT SILK 2 0SH CR/8 30 (SUTURE) IMPLANT
SUT SILK 3 0SH CR/8 30 (SUTURE) IMPLANT
SUT VIC AB 1 CTX 18 (SUTURE) ×1 IMPLANT
SUT VIC AB 1 CTX 36 (SUTURE)
SUT VIC AB 1 CTX36XBRD ANBCTR (SUTURE) IMPLANT
SUT VIC AB 2-0 CTX 36 (SUTURE) ×1 IMPLANT
SUT VIC AB 3-0 X1 27 (SUTURE) ×1 IMPLANT
SUT VICRYL 2 TP 1 (SUTURE) ×1 IMPLANT
SYSTEM SAHARA CHEST DRAIN ATS (WOUND CARE) ×3 IMPLANT
SYSTEM SAHARA CHEST DRAIN RE-I (WOUND CARE) ×1 IMPLANT
TAPE CLOTH SURG 4X10 WHT LF (GAUZE/BANDAGES/DRESSINGS) ×1 IMPLANT
TAPE UMBILICAL COTTON 1/8X30 (MISCELLANEOUS) ×3 IMPLANT
TOWEL OR 17X24 6PK STRL BLUE (TOWEL DISPOSABLE) ×6 IMPLANT
TOWEL OR 17X26 10 PK STRL BLUE (TOWEL DISPOSABLE) ×6 IMPLANT
TRAP SPECIMEN MUCOUS 40CC (MISCELLANEOUS) IMPLANT
TRAY FOLEY CATH 14FRSI W/METER (CATHETERS) ×3 IMPLANT
TUNNELER SHEATH ON-Q 11GX8 DSP (PAIN MANAGEMENT) IMPLANT
WATER STERILE IRR 1000ML POUR (IV SOLUTION) ×6 IMPLANT
YANKAUER SUCT BULB TIP NO VENT (SUCTIONS) ×1 IMPLANT

## 2014-01-29 NOTE — Anesthesia Preprocedure Evaluation (Signed)
Anesthesia Evaluation  Patient identified by MRN, date of birth, ID band Patient awake    Reviewed: Allergy & Precautions, H&P , NPO status , Patient's Chart, lab work & pertinent test results  Airway       Dental   Pulmonary former smoker,          Cardiovascular     Neuro/Psych    GI/Hepatic   Endo/Other    Renal/GU      Musculoskeletal   Abdominal   Peds  Hematology  (+) anemia ,   Anesthesia Other Findings Trauma ( horse fall )  Reproductive/Obstetrics                           Anesthesia Physical Anesthesia Plan  ASA: III  Anesthesia Plan: General   Post-op Pain Management:    Induction: Intravenous  Airway Management Planned: Double Lumen EBT  Additional Equipment: Arterial line  Intra-op Plan:   Post-operative Plan: Post-operative intubation/ventilation  Informed Consent: I have reviewed the patients History and Physical, chart, labs and discussed the procedure including the risks, benefits and alternatives for the proposed anesthesia with the patient or authorized representative who has indicated his/her understanding and acceptance.     Plan Discussed with: CRNA, Anesthesiologist and Surgeon  Anesthesia Plan Comments:         Anesthesia Quick Evaluation

## 2014-01-29 NOTE — Progress Notes (Signed)
Patient ID: Andrea Shaw, female   DOB: 11-15-1955, 58 y.o.   MRN: [id number] EVENING ROUNDS NOTE :     [address]       [name] [address]             [phone]                 Day of Surgery Procedure(s) (LRB): Left THORACOTOMY with evacuation of Hematoma and control of Chest Wall bleeding (Left) BRONCHOSCOPY (N/A)  Total Length of Stay:  LOS: 3 days  BP 100/63  Pulse 71  Temp(Src) 96.9 F (36.1 C) (Oral)  Resp 20  Ht 5' 6.5" (1.689 m)  Wt 147 lb 11.2 oz (66.996 kg)  BMI 23.48 kg/m2  SpO2 100%  .Intake/Output     09/22 0701 - 09/23 0700   P.O.    I.V. (mL/kg) 1856.7 (27.7)   Blood 1020   Total Intake(mL/kg) 2876.7 (42.9)   Urine (mL/kg/hr) 900 (1)   Blood 100 (0.1)   Chest Tube 1420 (1.6)   Total Output 2420   Net +456.7         . sodium chloride Stopped (01/29/14 1530)     Lab Results  Component Value Date   WBC 12.3* 01/29/2014   HGB 10.8* 01/29/2014   HCT 31.2* 01/29/2014   PLT 159 01/29/2014   GLUCOSE 112* 01/28/2014   ALT 17 01/27/2014   AST 34 01/27/2014   NA 134* 01/29/2014   K 3.9 01/29/2014   CL 103 01/28/2014   CREATININE 0.66 01/28/2014   BUN 8 01/28/2014   CO2 24 01/28/2014   INR 1.18 01/29/2014   Stable back in ICU extubated Not bleeding   Delight Ovens MD  Beeper [phone] Office [phone] 01/29/2014 8:17 PM

## 2014-01-29 NOTE — Progress Notes (Signed)
Patient ID: Andrea Shaw, female   DOB: 04-11-1956, 58 y.o.   MRN: [id number]  Called by RN about soft BP's and increased CT drainage. Ordered 1 liter NS bolus and CBC. Hgb had dropped 2.5g. Pressure improved with bolus but then dropped again into the 70's. Ordered emergency release blood, rapid response, and transfer to ICU. Thoracic surgery was consulted and will take pt to OR for thoracotomy. Pt stable on arrival to ICU with SBP in 90's but over 1 liter gross blood from CT since ~0800 today.  Critical care time: 1315 -- 1500    Freeman Caldron, PA-C Pager: [phone] General Trauma PA Pager: [phone]

## 2014-01-29 NOTE — Transfer of Care (Signed)
Immediate Anesthesia Transfer of Care Note  Patient: Andrea Shaw  Procedure(s) Performed: Procedure(s): Left THORACOTOMY with evacuation of Hematoma and control of Chest Wall bleeding (Left) BRONCHOSCOPY (N/A)  Patient Location: PACU  Anesthesia Type:General  Level of Consciousness: awake, alert  and patient cooperative  Airway & Oxygen Therapy: Patient Spontanous Breathing and Patient connected to face mask oxygen  Post-op Assessment: Report given to PACU RN, Post -op Vital signs reviewed and stable and Patient moving all extremities  Post vital signs: Reviewed and stable  Complications: No apparent anesthesia complications

## 2014-01-29 NOTE — Progress Notes (Signed)
I called MD on-call about Andrea Shaw CT output. Her output in 6hrs was 270cc of blood. Made MD aware of output. No orders where given at that time. Pt is stable and is no distress. Will continue to monitor pt condition.

## 2014-01-29 NOTE — Progress Notes (Addendum)
Needs to use IS more seriously.  Larger PTX.  On suction.  Looks pasty and pale.  Hemoglobin has dropped significantly.  Will repeat hemoglobin and gie two units of blood.  This patient has been seen and I agree with the findings and treatment plan.  Marta Lamas. Gae Bon, MD, FACS [phone] (pager) [phone] (direct pager) Trauma Surgeon

## 2014-01-29 NOTE — Brief Op Note (Addendum)
      [address]       [name] [address]             [phone]     01/29/2014  6:07 PM  PATIENT:  Andrea Shaw  58 y.o. female  PRE-OPERATIVE DIAGNOSIS:  Left hemothorax with active bleeding   POST-OPERATIVE DIAGNOSIS:  Left hemothorax with active bleeding from chest wall with hemothorax    PROCEDURE:  Procedure(s): Left THORACOTOMY with evacuation of Hematoma and control of Chest Wall bleeding (Left) BRONCHOSCOPY (N/A)  SURGEON:  Surgeon(s) and Role:    * Delight Ovens, MD - Primary  PHYSICIAN ASSISTANT: Coral Ceo PA  ANESTHESIA:   general  EBL:  Total I/O In: 1041.7 [I.V.:356.7; Blood:685] Out: 1520 [Blood:100; Chest Tube:1420]  BLOOD ADMINISTERED:2 unit prbcs 685 ml CC PRBC  DRAINS: Urinary Catheter (Foley), 28 Chest Tube(s) in the left and (32) Blake drain(s) in the left chest   LOCAL MEDICATIONS USED:  NONE  SPECIMEN:  No Specimen  DISPOSITION OF SPECIMEN:  N/A  COUNTS:  YES   DICTATION: .Dragon Dictation  PLAN OF CARE: return to icu  PATIENT DISPOSITION:  PACU - hemodynamically stable.   Delay start of Pharmacological VTE agent (>24hrs) due to surgical blood loss or risk of bleeding: yes  Findings : clotted blood in the left chest with arterial bleeder posterior lower chest wall

## 2014-01-29 NOTE — Progress Notes (Addendum)
Noted patient to be diaphoretic, skin cold and clammy, skin pale. VS taken and noted SBP 67/37, HR 112. Noted increased output on patient CT. Lung sounds clear but diminished on the lower lobes. MD made aware. New order noted. Will continue to monitor.

## 2014-01-29 NOTE — Significant Event (Signed)
Rapid Response Event Note  Overview:  Called to assist with patient with hypotension and increased bloody drainage from left chest tube Time Called: 1345 Arrival Time: 1350 Event Type: Hypotension  Initial Focused Assessment:  On arrival patient supine in bed - awake and alert - pale - warm and moist - states she feels better right now "rough earlier".  Oriented and co-op.  Daughter at bedside.  RR regular and unlabored - left CT patent to 20 cm suction- bloody drainage present - dark - no air leak noted - DDI.  Patient denies pain at present.  Very decreased BS left side - no tracheal deviation - voice clear.  No crepitus felt.  RN reports 1 liter NS bolus has finished.  Preparing to hang emergency release blood - per order Dale Milton PA.  BP 71/47 HR SR 89 RR 22 O2sats 97% on simple face mask - 10 liters.  Denies SOB. Abd soft.  No nausea.  NS infusing at 200 cc/hr.  Right lung clear.     Interventions:  Support and reassurance to patient.  Assisted with inititation of  PRBC infusion - IV site good.  Placed on heart monitor.  Dale Cacao PA present.  Prepared for transport to ICU.  Stat labs drawn - including TXCM.   BP 88/52 with PRBC infusing - patient is fluid dependent.  HR 88 RR 24 - no distress.  Update to daughter and patient per M. Jeffries.  Transported to 2S15 without incident.  Patient tol well.   Remains on current bed - did not transfer to new bed.  CT site DDI - no additional drainage at this time.  BP remains soft.  Handoff to [employer].  Family updated - Dr. Tyrone Sage in.    Event Summary: Name of Physician Notified: Dale [place] PA  at  (prior to RRT call)  Name of Consulting Physician Notified: Dr. Tyrone Sage at  (by Dale Benton)  Outcome: Transferred (Comment) [phone])  Event End Time: 1430  [place]

## 2014-01-29 NOTE — Anesthesia Procedure Notes (Signed)
Procedure Name: Intubation Date/Time: 01/29/2014 3:59 PM Performed by: Trixie Deis A Pre-anesthesia Checklist: Patient identified, Timeout performed, Emergency Drugs available, Suction available and Patient being monitored Patient Re-evaluated:Patient Re-evaluated prior to inductionOxygen Delivery Method: Circle system utilized Intubation Type: IV induction Ventilation: Mask ventilation without difficulty Laryngoscope Size: Mac and 3 Grade View: Grade I Endobronchial tube: Left and Double lumen EBT and 35 Fr Number of attempts: 1 Airway Equipment and Method: Stylet and LTA kit utilized Placement Confirmation: ETT inserted through vocal cords under direct vision,  breath sounds checked- equal and bilateral and positive ETCO2 Secured at: 27 cm Tube secured with: Tape Dental Injury: Teeth and Oropharynx as per pre-operative assessment

## 2014-01-29 NOTE — Progress Notes (Signed)
Check on patient and noted that patient BP remains low SBP 70's , patient more sleepy but arousable. MD made aware. New orders noted.

## 2014-01-29 NOTE — Progress Notes (Signed)
Patient ID: Andrea Shaw, female   DOB: [birth year], 58 y.o.   MRN: [id number]   LOS: 3 days   Subjective: Still with significant pain   Objective: Vital signs in last 24 hours: Temp:  [98.2 F (36.8 C)-99.6 F (37.6 C)] 99.6 F (37.6 C) (09/22 0547) Pulse Rate:  [71-100] 76 (09/22 0547) Resp:  [16-32] 17 (09/22 0547) BP: (105-123)/(59-77) 105/59 mmHg (09/22 0547) SpO2:  [81 %-100 %] 94 % (09/22 0547) Last BM Date: 01/26/14   IS:59ml   CT No air leak 349ml/24h    Laboratory  CBC  Recent Labs  01/28/14 1115 01/29/14 0409  WBC 10.5 10.0  HGB 10.9* 10.7*  HCT 32.3* 31.6*  PLT 214 200    Radiology Results CXR: PTX slightly enlarged (official read pending)   Physical Exam General appearance: alert and no distress Resp: clear to auscultation bilaterally Cardio: regular rate and rhythm GI: normal findings: bowel sounds normal and soft, non-tender   Assessment/Plan: Stepped on by horse  R rib FX 1,4/L rib Fx 8-12 with PTX - CT back to suction today Mandible FX, chin lac - panorex, ABX, non-chew diet per Dr. Chales Salmon  ABL anemia -- Stable FEN - Soft diet, increase oxyIR range, add NSAID VTE - SCD's, Lovenox  Dispo - CT    Freeman Caldron, PA-C Pager: [phone] General Trauma PA Pager: [phone]  01/29/2014

## 2014-01-29 NOTE — H&P (Signed)
Report given to [place], RN 2 S15.

## 2014-01-29 NOTE — Progress Notes (Signed)
Patient refusing to do incentive spirometer despite education given .

## 2014-01-29 NOTE — Progress Notes (Signed)
OT Cancellation Note  Patient Details Name: Andrea Shaw MRN: [id number] DOB: 10-10-1955   Cancelled Treatment:    Reason Eval/Treat Not Completed: Medical issues which prohibited therapy. Nurse in pt's room when OT arrived and said pt's BP too low to work with her right now.  Earlie Raveling OTR/L [phone] 01/29/2014, 11:57 AM

## 2014-01-29 NOTE — Consult Note (Signed)
[address]       [place] [address]             [phone]        Andrea Shaw [place] Medical Record #[id number] Date of Birth: July 27, 1955  Referring: No ref. provider found Primary Care: No primary provider on file.  Chief Complaint:    Chief Complaint  Patient presents with  . Fall    History of Present Illness:     Patient just moved to the SICU after draining 1000 ml from her left chest tube and becoming hypotensive. Was stepped on by horse 4 days ago and developed left ptx and chest was placed. Minimal drainage until today.   Current Activity/ Functional Status: Patient was independent with mobility/ambulation, transfers, ADL's, IADL's.   Zubrod Score: At the time of surgery this patient's most appropriate activity status/level should be described as:     0    Normal activity, no symptoms     1    Restricted in physical strenuous activity but ambulatory, able to do out light work     2    Ambulatory and capable of self care, unable to do work activities, up and about                 more than 50%  Of the time                                3    Only limited self care, in bed greater than 50% of waking hours     4    Completely disabled, no self care, confined to bed or chair     5    Moribund  History reviewed. No pertinent past medical history.  Past Surgical History  Procedure Laterality Date  . Tonsillectomy      History  Smoking status  . Former Smoker  Smokeless tobacco  . Never Used    History  Alcohol Use  . Yes    Comment: ocassionally    History   Social History  . Marital Status: Single    Spouse Name: N/A    Number of Children: N/A  . Years of Education: N/A   Occupational History  . Not on file.   Social History Main Topics  . Smoking status: Former Games developer  . Smokeless tobacco: Never Used  . Alcohol Use: Yes     Comment: ocassionally  . Drug Use: No  . Sexual Activity: Yes   Other  Topics Concern  . Not on file   Social History Narrative  . No narrative on file    Allergies  Allergen Reactions  . Penicillins Rash  . Other Other (See Comments)    [name] causes eye redness    Current Facility-Administered Medications  Medication Dose Route Frequency Provider Last Rate Last Dose  . 0.9 %  sodium chloride infusion   Intravenous Once Frederik Schmidt, MD      . 0.9 %  sodium chloride infusion   Intravenous Continuous Freeman Caldron, PA-C 200 mL/hr at 01/29/14 1343    . cephALEXin (KEFLEX) capsule 250 mg  250 mg Oral TID AC & HS Freeman Caldron, PA-C   250 mg at 01/29/14 1238  . docusate sodium (COLACE) capsule 100 mg  100 mg Oral BID Freeman Caldron, PA-C   100 mg at 01/29/14 0945  .  HYDROmorphone (DILAUDID) injection 0.5 mg  0.5 mg Intravenous Q4H PRN Freeman Caldron, PA-C      . Influenza vac split quadrivalent PF (FLUARIX) injection 0.5 mL  0.5 mL Intramuscular Tomorrow-1000 Frederik Schmidt, MD      . ipratropium-albuterol (DUONEB) 0.5-2.5 (3) MG/3ML nebulizer solution 3 mL  3 mL Nebulization Q6H PRN Violeta Gelinas, MD      . naproxen (NAPROSYN) tablet 500 mg  500 mg Oral BID WC Freeman Caldron, PA-C   500 mg at 01/29/14 1610  . neomycin-bacitracin-polymyxin (NEOSPORIN) ointment   Topical Daily Frederik Schmidt, MD      . ondansetron [facility]) tablet 4 mg  4 mg Oral Q6H PRN Harriette Bouillon, MD       Or  . ondansetron [facility]) injection 4 mg  4 mg Intravenous Q6H PRN Harriette Bouillon, MD   4 mg at 01/29/14 1142  . oxyCODONE (Oxy IR/ROXICODONE) immediate release tablet 10-20 mg  10-20 mg Oral Q4H PRN Freeman Caldron, PA-C   20 mg at 01/29/14 0947  . polyethylene glycol (MIRALAX / GLYCOLAX) packet 17 g  17 g Oral Daily Freeman Caldron, PA-C   17 g at 01/29/14 0948  . sodium chloride 0.9 % 1,000 mL with potassium chloride 20 mEq infusion   Intravenous Continuous Freeman Caldron, PA-C      . tiZANidine (ZANAFLEX) tablet 4 mg  4 mg Oral Q8H PRN Violeta Gelinas, MD         Prescriptions prior to admission  Medication Sig Dispense Refill  . ibuprofen (ADVIL,MOTRIN) 200 MG tablet Take 400 mg by mouth every 6 (six) hours as needed for moderate pain.        No family history on file.   Review of Systems:     Cardiac Review of Systems: Y or N  Chest Pain [    ]  Resting SOB [   ] Exertional SOB  [  ]  Orthopnea [  ]   Pedal Edema [   ]    Palpitations [  ] Syncope  [  ]   Presyncope [   ]  General Review of Systems: [Y] = yes [  ]=no Constitional: recent weight change [  ]; anorexia [  ]; fatigue [  ]; nausea [  ]; night sweats [  ]; fever [  ]; or chills [  ]                                                               Dental: poor dentition[  ]; Last Dentist visit:   Eye : blurred vision [  ]; diplopia [   ]; vision changes [  ];  Amaurosis fugax[  ]; Resp: cough [  ];  wheezing[  ];  hemoptysis[  ]; shortness of breath[  ]; paroxysmal nocturnal dyspnea[  ]; dyspnea on exertion[  ]; or orthopnea[  ];  GI:  gallstones[  ], vomiting[  ];  dysphagia[  ]; melena[  ];  hematochezia [  ]; heartburn[  ];   Hx of  Colonoscopy[  ]; GU: kidney stones [  ]; hematuria[  ];   dysuria [  ];  nocturia[  ];  history of     obstruction [  ]; urinary frequency [  ]  Skin: rash, swelling[  ];, hair loss[  ];  peripheral edema[  ];  or itching[  ]; Musculosketetal: myalgias[  ];  joint swelling[  ];  joint erythema[  ];  joint pain[  ];  back pain[  ];  Heme/Lymph: bruising[  ];  bleeding[  ];  anemia[  ];  Neuro: TIA[  ];  headaches[  ];  stroke[  ];  vertigo[  ];  seizures[  ];   paresthesias[  ];  difficulty walking[  ];  Psych:depression[  ]; anxiety[  ];  Endocrine: diabetes[  ];  thyroid dysfunction[  ];  Immunizations: Flu [  ]; Pneumococcal[  ];  Other:  Physical Exam: BP 93/60  Pulse 86  Temp(Src) 98.3 F (36.8 C) (Axillary)  Resp 28  Ht 5' 6.5" (1.689 m)  Wt 147 lb 11.2 oz (66.996 kg)  BMI 23.48 kg/m2  SpO2 100%  General appearance: alert,  cooperative, appears stated age and pale Neurologic: intact Heart: regular rate and rhythm, S1, S2 normal, no murmur, click, rub or gallop Lungs: diminished breath sounds bilaterally Abdomen: soft, non-tender; bowel sounds normal; no masses,  no organomegaly Extremities: extremities normal, atraumatic, no cyanosis or edema and Homans sign is negative, no sign of DVT bleeding from ct 1000 ml past several hours   Diagnostic Studies & Laboratory data:     Recent Radiology Findings:   Dg Chest Port 1 View  01/29/2014   CLINICAL DATA:  Left rib fractures.  Increased effusions.  EXAM: PORTABLE CHEST - 1 VIEW 2:39 p.m.  COMPARISON:  01/29/2014 at 6:07 a.m.  FINDINGS: There has been a marked increase in the left pleural effusion. Respiratory apparatus obscures the detail of the left lung apex. There is no appreciable left pneumothorax.  There is a persistent small right pleural effusion. Pulmonary vascularity is normal.  IMPRESSION: Marked increase in the left effusion. No visible pneumothorax but the detail is obscured by respiratory apparatus.   Electronically Signed   By: Geanie Cooley M.D.   On: 01/29/2014 14:50   Dg Chest Port 1 View  01/29/2014   CLINICAL DATA:  Pneumothorax.  EXAM: PORTABLE CHEST - 1 VIEW  COMPARISON:  01/28/2014, 01/27/2014, and 01/26/2014  FINDINGS: There is a slight increase in the small left apical pneumothorax. Left chest tube with process. Increased right base atelectasis with new effusion. Slightly improved aeration in the left midzone. Atelectasis at the left base.  IMPRESSION: 1. Slight increase in small left apical pneumothorax. 2. New right effusion with increased right base atelectasis. 3. Persistent atelectasis and effusion at the left base with improved aeration in the midzone.   Electronically Signed   By: Geanie Cooley M.D.   On: 01/29/2014 07:56   Dg Chest Port 1 View  01/28/2014   CLINICAL DATA:  History of fall and complained of left chest pain. Patient has a left  chest tube.  EXAM: PORTABLE CHEST - 1 VIEW  COMPARISON:  01/27/2014  FINDINGS: The left chest tube is stable in position. There continues to be subcutaneous gas on the left side of the chest. Question a tiny left apical pneumothorax. Persistent patchy densities throughout the left mid and lower lung region. Densities at the right lung base could represent atelectasis and cannot exclude a small right pleural effusion. Negative for a right pneumothorax. Heart size is normal. The trachea is midline.  IMPRESSION: Stable position of the left chest tube without a large pneumothorax. Question a tiny left apical pneumothorax.  Stable parenchymal densities in  the left mid and lower lung region.  Stable right basilar chest densities.   Electronically Signed   By: Richarda Overlie M.D.   On: 01/28/2014 07:55   Dg Chest Port 1 View  01/27/2014   CLINICAL DATA:  Left chest tube.  EXAM: PORTABLE CHEST - 1 VIEW  COMPARISON:  01/26/2014  FINDINGS: Left chest tube is stable. There is subtle evidence of a tiny apical pneumothorax on the current exam. This is not evident on the previous day's study. The difference may be technical only.  Opacity in the left mid and lower lung is accentuated by lower lung volumes on the current exam. Some increased opacity at the left lung base is noted consistent with atelectasis. Left mid lung opacity may reflect a combination of atelectasis and contusion.  Small amount of subcutaneous air left overlies the left neck base and left lateral chest wall, stable.  Mild right lung base atelectasis.  Cardiac silhouette is normal in size. No mediastinal or hilar masses.  IMPRESSION: 1. Tiny apical pneumothorax is suggested. Chest tube is stable appearance. 2. Mild increase in lung base atelectasis. 3. Left mid to lower lung opacity is similar to the prior study allowing for lower lung volumes. This is most likely a combination atelectasis and contusion.   Electronically Signed   By: Amie Portland M.D.   On:  01/27/2014 18:43      Recent Lab Findings: Lab Results  Component Value Date   WBC 22.4* 01/29/2014   HGB 8.3* 01/29/2014   HCT 25.0* 01/29/2014   PLT 269 01/29/2014   GLUCOSE 112* 01/28/2014   ALT 17 01/27/2014   AST 34 01/27/2014   NA 140 01/28/2014   K 4.1 01/28/2014   CL 103 01/28/2014   CREATININE 0.66 01/28/2014   BUN 8 01/28/2014   CO2 24 01/28/2014   INR 1.00 01/26/2014   CBC Latest Ref Rng 01/29/2014 01/29/2014 01/28/2014  WBC 4.0 - 10.5 K/uL 22.4(H) 10.0 10.5  Hemoglobin 12.0 - 15.0 g/dL 8.3(L) 10.7(L) 10.9(L)  Hematocrit 36.0 - 46.0 % 25.0(L) 31.6(L) 32.3(L)  Platelets 150 - 400 K/uL 269 200 214       Assessment / Plan:     Left hemothorax with active bleeding without obvious abdominal inj or spleen inj. I have recommended immediate left thoracotomy for hemthoracic and active bleeding. Patient alerts and orientated , agreeable with plan.   The goals risks and alternatives of the planned surgical procedure bronch and left thoracotomy have been discussed with the patient in detail. The risks of the procedure including death, infection, stroke, myocardial infarction, bleeding, blood transfusion have all been discussed specifically.  I have quoted Chad Cordial a 10 % of perioperative mortality and a complication rate as high as 25 %. The patient's questions have been answered.Andrea Shaw is willing  to proceed with the planned procedure.    Delight Ovens MD      [address] [place] [place] [address] Office [phone]   Beeper [phone]  01/29/2014 3:16 PM

## 2014-01-30 ENCOUNTER — Inpatient Hospital Stay (HOSPITAL_COMMUNITY): Payer: Managed Care, Other (non HMO)

## 2014-01-30 LAB — COMPREHENSIVE METABOLIC PANEL
ALT: 11 U/L (ref 0–35)
AST: 17 U/L (ref 0–37)
Albumin: 2.1 g/dL — ABNORMAL LOW (ref 3.5–5.2)
Alkaline Phosphatase: 44 U/L (ref 39–117)
Anion gap: 6 (ref 5–15)
BUN: 10 mg/dL (ref 6–23)
CO2: 25 mEq/L (ref 19–32)
Calcium: 7.5 mg/dL — ABNORMAL LOW (ref 8.4–10.5)
Chloride: 103 mEq/L (ref 96–112)
Creatinine, Ser: 0.56 mg/dL (ref 0.50–1.10)
GFR calc Af Amer: >90 mL/min (ref >90)
GFR calc non Af Amer: >90 mL/min (ref >90)
Glucose, Bld: 160 mg/dL — ABNORMAL HIGH (ref 70–99)
Potassium: 4.1 mEq/L (ref 3.7–5.3)
Sodium: 134 mEq/L — ABNORMAL LOW (ref 137–147)
Total Bilirubin: 0.6 mg/dL (ref 0.3–1.2)
Total Protein: 4.5 g/dL — ABNORMAL LOW (ref 6.0–8.3)

## 2014-01-30 LAB — POCT I-STAT 3, ART BLOOD GAS (G3+)
Bicarbonate: 24 meq/L (ref 20.0–24.0)
O2 Saturation: 96 %
PCO2 ART: 37 mmHg (ref 35.0–45.0)
PO2 ART: 84 mmHg (ref 80.0–100.0)
Patient temperature: 99.5
TCO2: 25 mmol/L (ref 0–100)
pH, Arterial: 7.423 (ref 7.350–7.450)

## 2014-01-30 LAB — CBC
HCT: 26.6 % — ABNORMAL LOW (ref 36.0–46.0)
Hemoglobin: 9.5 g/dL — ABNORMAL LOW (ref 12.0–15.0)
MCH: 29.4 pg (ref 26.0–34.0)
MCHC: 35.7 g/dL (ref 30.0–36.0)
MCV: 82.4 fL (ref 78.0–100.0)
PLATELETS: 159 10*3/uL (ref 150–400)
RBC: 3.23 MIL/uL — AB (ref 3.87–5.11)
RDW: 16 % — AB (ref 11.5–15.5)
WBC: 10.2 10*3/uL (ref 4.0–10.5)

## 2014-01-30 MED ORDER — KCL IN DEXTROSE-NACL 20-5-0.45 MEQ/L-%-% IV SOLN
INTRAVENOUS | Status: DC
  Administered 2014-02-02: 01:00:00 via INTRAVENOUS
  Filled 2014-01-30 (×5): qty 1000

## 2014-01-30 MED FILL — Sodium Chloride IV Soln 0.9%: INTRAVENOUS | Qty: 2000 | Status: AC

## 2014-01-30 MED FILL — Heparin Sodium (Porcine) Inj 1000 Unit/ML: INTRAMUSCULAR | Qty: 30 | Status: AC

## 2014-01-30 NOTE — Anesthesia Postprocedure Evaluation (Signed)
  Anesthesia Post-op Note  Patient: Andrea Shaw  Procedure(s) Performed: Procedure(s): Left THORACOTOMY with evacuation of Hematoma and control of Chest Wall bleeding (Left) BRONCHOSCOPY (N/A)  Patient Location: PACU  Anesthesia Type:General  Level of Consciousness: awake, alert , oriented and patient cooperative  Airway and Oxygen Therapy: Patient Spontanous Breathing  Post-op Pain: moderate  Post-op Assessment: Post-op Vital signs reviewed, Patient's Cardiovascular Status Stable, Respiratory Function Stable, Patent Airway and No signs of Nausea or vomiting  Post-op Vital Signs: stable  Last Vitals:  Filed Vitals:   01/30/14 0748  BP:   Pulse:   Temp:   Resp: 28    Complications: No apparent anesthesia complications

## 2014-01-30 NOTE — Progress Notes (Addendum)
      [address]       Andrea Shaw [address]             [phone]      1 Day Post-Op Procedure(s) (LRB): Left THORACOTOMY with evacuation of Hematoma and control of Chest Wall bleeding (Left) BRONCHOSCOPY (N/A)  Subjective:  Andrea Shaw feels okay this morning.  She does have some pain which is relieved with pain medication.  She is getting ready to ambulate this morning.  Objective: Vital signs in last 24 hours: Temp:  [96.9 F (36.1 C)-99.5 F (37.5 C)] 98.9 F (37.2 C) (09/23 0715) Pulse Rate:  [30-112] 90 (09/23 0700) Cardiac Rhythm:  [-] Normal sinus rhythm (09/23 0800) Resp:  [13-30] 28 (09/23 0748) BP: (67-132)/(37-78) 99/58 mmHg (09/23 0700) SpO2:  [91 %-100 %] 99 % (09/23 0748) Arterial Line BP: (95-120)/(45-62) 104/50 mmHg (09/23 0700) Weight:  [141 lb 1.5 oz (64 kg)-147 lb 11.2 oz (66.996 kg)] 141 lb 1.5 oz (64 kg) (09/23 0600)  Intake/Output from previous day: 09/22 0701 - 09/23 0700 In: 3966.3 [I.V.:2946.3; Blood:1020] Out: 3620 [Urine:1930; Blood:100; Chest Tube:1590] Intake/Output this shift: Total I/O In: 281.5 [I.V.:281.5] Out: 75 [Urine:45; Chest Tube:30]  General appearance: alert, cooperative and no distress Heart: regular rate and rhythm Lungs: diminished breath sounds left base Abdomen: soft, non-tender; bowel sounds normal; no masses,  no organomegaly Extremities: extremities normal, atraumatic, no cyanosis or edema Wound: clean and dry, no air leak with chest tubes  Lab Results:  Recent Labs  01/29/14 1800 01/30/14 0450  WBC 12.3* 10.2  HGB 10.8* 9.5*  HCT 31.2* 26.6*  PLT 159 159   BMET:  Recent Labs  01/28/14 1115 01/29/14 1731 01/30/14 0640  NA 140 134* 134*  K 4.1 3.9 4.1  CL 103  --  103  CO2 24  --  25  GLUCOSE 112*  --  160*  BUN 8  --  10  CREATININE 0.66  --  0.56  CALCIUM 8.4  --  7.5*    PT/INR:  Recent Labs  01/29/14 1500  LABPROT 15.0  INR 1.18   ABG    Component Value Date/Time   PHART 7.423 01/30/2014 0458   HCO3 24.0 01/30/2014 0458   TCO2 25 01/30/2014 0458   ACIDBASEDEF 4.0* 01/29/2014 1731   O2SAT 96.0 01/30/2014 0458   CBG (last 3)  No results found for this basename: GLUCAP,  in the last 72 hours  Assessment/Plan: S/P Procedure(s) (LRB): Left THORACOTOMY with evacuation of Hematoma and control of Chest Wall bleeding (Left) BRONCHOSCOPY (N/A)  1. Chest tube- no air leak appreciated, 1590 cc output since surgery, CXR with small residual pleural effusion and atelectasis 2. D/C Arterial Line 3. Decrease IV Fluids 4. Remove Foley  5. Dispo- will place chest tubes to water seal, leave both in place for now, hopefully can remove one tomorrow, care per trauma   LOS: 4 days    BARRETT, Andrea Shaw 01/30/2014  I have seen and examined Andrea Shaw and agree with the above assessment  and plan.  Delight Ovens MD Beeper [phone] Office [phone] 01/30/2014 4:49 PM

## 2014-01-30 NOTE — Progress Notes (Signed)
Trauma Service Note  Subjective: Patient looks so much better this AM.  Sitting up in chair, minimal distress.  Objective: Vital signs in last 24 hours: Temp:  [96.9 F (36.1 C)-99.5 F (37.5 C)] 98.9 F (37.2 C) (09/23 0715) Pulse Rate:  [30-112] 90 (09/23 0700) Resp:  [13-30] 28 (09/23 0748) BP: (67-132)/(37-78) 99/58 mmHg (09/23 0700) SpO2:  [91 %-100 %] 99 % (09/23 0748) Arterial Line BP: (95-120)/(45-62) 104/50 mmHg (09/23 0700) Weight:  [64 kg (141 lb 1.5 oz)-66.996 kg (147 lb 11.2 oz)] 64 kg (141 lb 1.5 oz) (09/23 0600) Last BM Date: 01/26/14  Intake/Output from previous day: 09/22 0701 - 09/23 0700 In: 3966.3 [I.V.:2946.3; Blood:1020] Out: 3620 [Urine:1930; Blood:100; Chest Tube:1590] Intake/Output this shift: Total I/O In: -  Out: 45 [Urine:45]  General: No acute distress  Lungs: Clear to auscultation.  CXR shows left basilar effusion.  Atelectasis bibasilar  Abd: Soft, good bowel sounds.  Tolerating clear liquids  Extremities: No changes.  No clinical signs or symptoms of DVT  Neuro: Intact  Lab Results: CBC   Recent Labs  01/29/14 1800 01/30/14 0450  WBC 12.3* 10.2  HGB 10.8* 9.5*  HCT 31.2* 26.6*  PLT 159 159   BMET  Recent Labs  01/28/14 1115 01/29/14 1731 01/30/14 0640  NA 140 134* 134*  K 4.1 3.9 4.1  CL 103  --  103  CO2 24  --  25  GLUCOSE 112*  --  160*  BUN 8  --  10  CREATININE 0.66  --  0.56  CALCIUM 8.4  --  7.5*   PT/INR  Recent Labs  01/29/14 1500  LABPROT 15.0  INR 1.18   ABG  Recent Labs  01/29/14 1731 01/30/14 0458  PHART 7.312* 7.423  HCO3 22.7 24.0    Studies/Results: Dg Chest Port 1 View  01/30/2014   CLINICAL DATA:  Recent hemothorax  EXAM: PORTABLE CHEST - 1 VIEW  COMPARISON:  January 29, 2014.  FINDINGS: There are 2 chest tubes on the left. There is no demonstrable pneumothorax. There is consolidation in the left base with probable small left effusion. There is atelectatic change in the right  base. Heart is borderline enlarged with pulmonary vascularity normal. Central catheter tip is at the cavoatrial junction.  IMPRESSION: Tube and catheter positions as described without apparent pneumothorax. Consolidation left base. Atelectatic change right base. There is a probable small left effusion, stable. Small right effusion noted 1 day prior is not appreciable currently.   Electronically Signed   By: Bretta Bang M.D.   On: 01/30/2014 08:01   Dg Chest Port 1 View  01/29/2014   CLINICAL DATA:  Status post recent chest surgery, check chest tube position  EXAM: PORTABLE CHEST - 1 VIEW  COMPARISON:  01/29/2014 1439 hr  FINDINGS: Cardiac shadow is stable. The previously seen left-sided chest tube is been removed and 2 new chest tubes have been placed in the left chest cavity. No pneumothorax is seen. Significant reduction in left pleural effusion is noted. Left basilar atelectasis remains. A right jugular central line is noted in the mid superior vena cava. Small right-sided pleural effusion is noted. No focal infiltrate is seen.  IMPRESSION: Significant reduction in left pleural effusion following chest tube placement.  Small right effusion.  Persistent left basilar atelectasis.   Electronically Signed   By: Alcide Clever M.D.   On: 01/29/2014 20:05   Dg Chest Port 1 View  01/29/2014   CLINICAL DATA:  Left rib fractures.  Increased effusions.  EXAM: PORTABLE CHEST - 1 VIEW 2:39 p.m.  COMPARISON:  01/29/2014 at 6:07 a.m.  FINDINGS: There has been a marked increase in the left pleural effusion. Respiratory apparatus obscures the detail of the left lung apex. There is no appreciable left pneumothorax.  There is a persistent small right pleural effusion. Pulmonary vascularity is normal.  IMPRESSION: Marked increase in the left effusion. No visible pneumothorax but the detail is obscured by respiratory apparatus.   Electronically Signed   By: Geanie Cooley M.D.   On: 01/29/2014 14:50   Dg Chest Port 1  View  01/29/2014   CLINICAL DATA:  Pneumothorax.  EXAM: PORTABLE CHEST - 1 VIEW  COMPARISON:  01/28/2014, 01/27/2014, and 01/26/2014  FINDINGS: There is a slight increase in the small left apical pneumothorax. Left chest tube with process. Increased right base atelectasis with new effusion. Slightly improved aeration in the left midzone. Atelectasis at the left base.  IMPRESSION: 1. Slight increase in small left apical pneumothorax. 2. New right effusion with increased right base atelectasis. 3. Persistent atelectasis and effusion at the left base with improved aeration in the midzone.   Electronically Signed   By: Geanie Cooley M.D.   On: 01/29/2014 07:56    Anti-infectives: Anti-infectives   Start     Dose/Rate Route Frequency Ordered Stop   01/30/14 0200  vancomycin (VANCOCIN) IVPB 1000 mg/200 mL premix     1,000 mg 200 mL/hr over 60 Minutes Intravenous Every 12 hours 01/29/14 2107 01/30/14 0339   01/29/14 1600  vancomycin (VANCOCIN) IVPB 1000 mg/200 mL premix     1,000 mg 200 mL/hr over 60 Minutes Intravenous  Once 01/29/14 1551 01/29/14 1559   01/29/14 1600  cefUROXime (ZINACEF) 1.5 g in dextrose 5 % 50 mL IVPB  Status:  Discontinued     1.5 g 100 mL/hr over 30 Minutes Intravenous 3 times per day 01/29/14 1552 01/29/14 2009   01/29/14 0900  [MAR Hold]  cephALEXin (KEFLEX) capsule 250 mg  Status:  Discontinued     (On MAR Hold since 01/29/14 1530)   250 mg Oral 3 times daily before meals & bedtime 01/29/14 0806 01/29/14 1803   01/27/14 1800  ceFAZolin (ANCEF) IVPB 1 g/50 mL premix  Status:  Discontinued     1 g 100 mL/hr over 30 Minutes Intravenous Every 8 hours 01/27/14 1719 01/29/14 0806   01/26/14 2115  clindamycin (CLEOCIN) IVPB 600 mg     600 mg 100 mL/hr over 30 Minutes Intravenous  Once 01/26/14 2101 01/26/14 2209      Assessment/Plan: s/p Procedure(s): Left THORACOTOMY with evacuation of Hematoma and control of Chest Wall bleeding BRONCHOSCOPY Advance diet Decrease  IVF Possible transfer to SDU  LOS: 4 days   Marta Lamas. Gae Bon, MD, FACS [phone] Trauma Surgeon 01/30/2014

## 2014-01-30 NOTE — Progress Notes (Signed)
Physical Therapy Treatment Patient Details Name: Andrea Shaw MRN: [id number] DOB: May 02, 1956 Today's Date: 2014/02/15    History of Present Illness 58 y.o. female admitted to [facility] on 01/26/14 after being kicked by a horse.  She had resultant multiple bil rib fxs (L side more than right), L PTX with L chest tube and mandible fx with multiple lacerations on her chin.  Pt underwent emergency surgery on 9/22 for thoracotomy and evacuation of hematoma. Pt with no significant PMHx.     PT Comments    Pt s/p surgery yesterday. Progressing well. Goals remain appropriate.  Follow Up Recommendations  Supervision for mobility/OOB;No PT follow up     Equipment Recommendations  Other (comment) (to be assessed)    Recommendations for Other Services       Precautions / Restrictions Precautions Precautions: Other (comment) Precaution Comments: Multiple lines/tubes.    Mobility  Bed Mobility                  Transfers Overall transfer level: Needs assistance Equipment used: Pushed w/c Transfers: Sit to/from Stand Sit to Stand: Min guard         General transfer comment: Assist for lines/tubes and safety  Ambulation/Gait Ambulation/Gait assistance: Min guard Ambulation Distance (Feet): 150 Feet Assistive device:  (pushing w/c) Gait Pattern/deviations: Step-through pattern;Decreased stride length Gait velocity: decreased Gait velocity interpretation: Below normal speed for age/gender General Gait Details: Pt with slow gait. No loss of balance and using w/c for support and to carry equipment.   Stairs            Wheelchair Mobility    Modified Rankin (Stroke Patients Only)       Balance   Sitting-balance support: No upper extremity supported;Feet supported Sitting balance-Leahy Scale: Good       Standing balance-Leahy Scale: Poor Standing balance comment: support of holding w/c                    Cognition Arousal/Alertness:  Awake/alert Behavior During Therapy: WFL for tasks assessed/performed Overall Cognitive Status: Within Functional Limits for tasks assessed                      Exercises      General Comments        Pertinent Vitals/Pain Pain Assessment: 0-10 Pain Score: 5  Pain Location: lt chest Pain Descriptors / Indicators: Burning;Aching Pain Intervention(s): Limited activity within patient's tolerance;PCA encouraged;Repositioned    Home Living                      Prior Function            PT Goals (current goals can now be found in the care plan section) Progress towards PT goals: Progressing toward goals    Frequency  Min 3X/week    PT Plan Current plan remains appropriate    Co-evaluation             End of Session Equipment Utilized During Treatment: Oxygen Activity Tolerance: Patient tolerated treatment well Patient left: in chair;with call bell/phone within reach;with family/visitor present     Time: 4098-1191 PT Time Calculation (min): 32 min  Charges:  $Gait Training: 23-37 mins                    G Codes:      Gurkaran Rahm 02-15-2014, 10:39 AM  Skip Mayer PT [phone]

## 2014-01-30 NOTE — Evaluation (Signed)
Occupational Therapy Evaluation Patient Details Name: Andrea Shaw MRN: [id number] DOB: [birth year] Today's Date: 01/30/2014    History of Present Illness 58 y.o. female admitted to [facility] on 01/26/14 after being kicked by a horse.  She had resultant multiple bil rib fxs (L side more than right), L PTX with L chest tube and mandible fx with multiple lacerations on her chin.  Pt underwent emergency surgery on 9/22 for thoracotomy and evacuation of hematoma. Pt with no significant PMHx.    Clinical Impression   Prior to admission, pt was independent.  She now presents with pain, generalized weakness, multiple lines, and impaired balance interfering with ability to perform ADL.  Pt is moving remarkably well. UE functional use and ability to access feet for bathing and dressing is limited by rib area pain.  Pt has a supportive husband.  Explained the multiple uses of 3 in 1 for home.  Pt does not prefer socks and will rely on her husband to assist with LB ADL as needed.  Will follow acutely.  Follow Up Recommendations  No OT follow up;Supervision/Assistance - 24 hour (initially)    Equipment Recommendations  3 in 1 bedside comode    Recommendations for Other Services       Precautions / Restrictions Precautions Precautions: Other (comment) (multiple lines) Precaution Comments: Multiple lines/tubes. Restrictions Weight Bearing Restrictions: No      Mobility Bed Mobility Overal bed mobility:  (not tested, pt up in chair)                Transfers Overall transfer level: Needs assistance Equipment used: 1 person hand held assist Transfers: Stand Pivot Transfers Sit to Stand: Min guard Stand pivot transfers: Min assist       General transfer comment: Assist for lines/tubes and safety    Balance   Sitting-balance support: No upper extremity supported;Feet supported Sitting balance-Leahy Scale: Good       Standing balance-Leahy Scale: Poor Standing balance comment:  support of holding w/c                            ADL Overall ADL's : Needs assistance/impaired Eating/Feeding: Independent;Sitting   Grooming: Wash/dry hands;Wash/dry face;Sitting;Set up   Upper Body Bathing: Minimal assitance;Sitting (for back)   Lower Body Bathing: Moderate assistance;Sit to/from stand   Upper Body Dressing : Minimal assistance;Sitting   Lower Body Dressing: Moderate assistance;Sit to/from stand   Toilet Transfer: Minimal assistance;Stand-pivot;BSC   Toileting- Clothing Manipulation and Hygiene: Minimal assistance;Sit to/from stand       Functional mobility during ADLs:  (did not ambulate with pt) General ADL Comments: Pain precludes pt from being able to access her feet.  Husband can assist at home.  Pt does not wear socks at home.     Vision                     Perception     Praxis      Pertinent Vitals/Pain Pain Assessment: 0-10 Pain Score: 5  Pain Location: ribs Pain Descriptors / Indicators: Aching Pain Intervention(s): Premedicated before session;Repositioned;Monitored during session     Hand Dominance Right   Extremity/Trunk Assessment Upper Extremity Assessment Upper Extremity Assessment: Generalized weakness (difficult to assess due to rib pain, has full AROM)   Lower Extremity Assessment Lower Extremity Assessment: Defer to PT evaluation   Cervical / Trunk Assessment Cervical / Trunk Assessment: Normal   Communication Communication Communication: No difficulties  Cognition Arousal/Alertness: Awake/alert Behavior During Therapy: WFL for tasks assessed/performed Overall Cognitive Status: Within Functional Limits for tasks assessed                     General Comments       Exercises       Shoulder Instructions      Home Living Family/patient expects to be discharged to:: Private residence Living Arrangements: Spouse/significant other Available Help at Discharge: Family;Available 24  hours/day Type of Home: House Home Access: Stairs to enter [employer] of Steps: 2 Entrance Stairs-Rails: Right;Left Home Layout: Two level;Full bath on main level;Able to live on main level with bedroom/bathroom Alternate Level Stairs-Number of Steps: flight   Bathroom Shower/Tub: Chief Strategy Officer: Standard     Home Equipment: None          Prior Functioning/Environment Level of Independence: Independent        Comments: drives, works for the city of [employer]    OT Diagnosis: Generalized weakness;Acute pain   OT Problem List: Decreased strength;Decreased activity tolerance;Impaired balance (sitting and/or standing);Decreased knowledge of use of DME or AE;Pain;Impaired UE functional use   OT Treatment/Interventions: Self-care/ADL training;DME and/or AE instruction;Energy conservation;Therapeutic activities;Patient/family education    OT Goals(Current goals can be found in the care plan section) Acute Rehab OT Goals Patient Stated Goal: to decrease her pain OT Goal Formulation: With patient Time For Goal Achievement: 02/13/14 Potential to Achieve Goals: Good ADL Goals Pt Will Perform Grooming: with supervision;standing (x 3 activities) Pt Will Perform Upper Body Bathing: with set-up;sitting Pt Will Perform Lower Body Bathing: with min assist;sit to/from stand Pt Will Perform Upper Body Dressing: with set-up;sitting Pt Will Perform Lower Body Dressing: with min assist;sit to/from stand Pt Will Transfer to Toilet: with modified independence;ambulating;bedside commode (over toilet) Pt Will Perform Toileting - Clothing Manipulation and hygiene: with modified independence;sit to/from stand Pt Will Perform Tub/Shower Transfer: Tub transfer;with min guard assist;3 in 1  OT Frequency: Min 2X/week   Barriers to D/C:            Co-evaluation              End of Session Nurse Communication:  (pt wants tomato soup)  Activity Tolerance:  Patient limited by pain Patient left: in chair;with call bell/phone within reach;with nursing/sitter in room;with family/visitor present   Time: 9604-5409 OT Time Calculation (min): 19 min Charges:  OT General Charges $OT Visit: 1 Procedure OT Evaluation $Initial OT Evaluation Tier I: 1 Procedure OT Treatments $Self Care/Home Management : 8-22 mins G-Codes:    Evern Bio 01/30/2014, 1:52 PM

## 2014-01-31 ENCOUNTER — Inpatient Hospital Stay (HOSPITAL_COMMUNITY): Payer: Managed Care, Other (non HMO)

## 2014-01-31 LAB — CBC
HCT: 26.2 % — ABNORMAL LOW (ref 36.0–46.0)
Hemoglobin: 9 g/dL — ABNORMAL LOW (ref 12.0–15.0)
MCH: 29.7 pg (ref 26.0–34.0)
MCHC: 34.4 g/dL (ref 30.0–36.0)
MCV: 86.5 fL (ref 78.0–100.0)
Platelets: 241 10*3/uL (ref 150–400)
RBC: 3.03 MIL/uL — ABNORMAL LOW (ref 3.87–5.11)
RDW: 16.1 % — ABNORMAL HIGH (ref 11.5–15.5)
WBC: 10.1 10*3/uL (ref 4.0–10.5)

## 2014-01-31 LAB — COMPREHENSIVE METABOLIC PANEL
ALT: 14 U/L (ref 0–35)
AST: 18 U/L (ref 0–37)
Albumin: 2 g/dL — ABNORMAL LOW (ref 3.5–5.2)
Alkaline Phosphatase: 52 U/L (ref 39–117)
Anion gap: 8 (ref 5–15)
BUN: 7 mg/dL (ref 6–23)
CALCIUM: 7.6 mg/dL — AB (ref 8.4–10.5)
CO2: 24 mEq/L (ref 19–32)
Chloride: 109 mEq/L (ref 96–112)
Creatinine, Ser: 0.52 mg/dL (ref 0.50–1.10)
GLUCOSE: 103 mg/dL — AB (ref 70–99)
Potassium: 4.2 mEq/L (ref 3.7–5.3)
Sodium: 141 mEq/L (ref 137–147)
Total Bilirubin: 0.4 mg/dL (ref 0.3–1.2)
Total Protein: 4.8 g/dL — ABNORMAL LOW (ref 6.0–8.3)

## 2014-01-31 NOTE — Progress Notes (Signed)
[facility] Trauma Service  Progress Note   LOS: 5 days   Subjective: Pt doing well.  Having some trouble breathing and using IS.  Appetite improving.  No N/V or abdominal pain.  Ambulating OOB.  IS got up to 750 yesterday afternoon, still at 500 today.    Objective: Vital signs in last 24 hours: Temp:  [97.4 F (36.3 C)-98.4 F (36.9 C)] 98.2 F (36.8 C) (09/24 0454) Pulse Rate:  [64-109] 84 (09/24 0454) Resp:  [17-36] 22 (09/24 0454) BP: (88-106)/(49-79) 103/68 mmHg (09/24 0454) SpO2:  [88 %-100 %] 96 % (09/24 0454) Arterial Line BP: (99-100)/(47-50) 99/47 mmHg (09/23 0900) Last BM Date: 01/26/14  Lab Results:  CBC  Recent Labs  01/30/14 0450 01/31/14 0435  WBC 10.2 10.1  HGB 9.5* 9.0*  HCT 26.6* 26.2*  PLT 159 241   BMET  Recent Labs  01/30/14 0640 01/31/14 0435  NA 134* 141  K 4.1 4.2  CL 103 109  CO2 25 24  GLUCOSE 160* 103*  BUN 10 7  CREATININE 0.56 0.52  CALCIUM 7.5* 7.6*    Imaging: Dg Chest Port 1 View  01/30/2014   CLINICAL DATA:  Recent hemothorax  EXAM: PORTABLE CHEST - 1 VIEW  COMPARISON:  January 29, 2014.  FINDINGS: There are 2 chest tubes on the left. There is no demonstrable pneumothorax. There is consolidation in the left base with probable small left effusion. There is atelectatic change in the right base. Heart is borderline enlarged with pulmonary vascularity normal. Central catheter tip is at the cavoatrial junction.  IMPRESSION: Tube and catheter positions as described without apparent pneumothorax. Consolidation left base. Atelectatic change right base. There is a probable small left effusion, stable. Small right effusion noted 1 day prior is not appreciable currently.   Electronically Signed   By: Bretta Bang M.D.   On: 01/30/2014 08:01   Dg Chest Port 1 View  01/29/2014   CLINICAL DATA:  Status post recent chest surgery, check chest tube position  EXAM: PORTABLE CHEST - 1 VIEW  COMPARISON:  01/29/2014 1439 hr   FINDINGS: Cardiac shadow is stable. The previously seen left-sided chest tube is been removed and 2 new chest tubes have been placed in the left chest cavity. No pneumothorax is seen. Significant reduction in left pleural effusion is noted. Left basilar atelectasis remains. A right jugular central line is noted in the mid superior vena cava. Small right-sided pleural effusion is noted. No focal infiltrate is seen.  IMPRESSION: Significant reduction in left pleural effusion following chest tube placement.  Small right effusion.  Persistent left basilar atelectasis.   Electronically Signed   By: Alcide Clever M.D.   On: 01/29/2014 20:05   Dg Chest Port 1 View  01/29/2014   CLINICAL DATA:  Left rib fractures.  Increased effusions.  EXAM: PORTABLE CHEST - 1 VIEW 2:39 p.m.  COMPARISON:  01/29/2014 at 6:07 a.m.  FINDINGS: There has been a marked increase in the left pleural effusion. Respiratory apparatus obscures the detail of the left lung apex. There is no appreciable left pneumothorax.  There is a persistent small right pleural effusion. Pulmonary vascularity is normal.  IMPRESSION: Marked increase in the left effusion. No visible pneumothorax but the detail is obscured by respiratory apparatus.   Electronically Signed   By: Geanie Cooley M.D.   On: 01/29/2014 14:50    PE: General: pleasant, WD/WN white female who is laying in bed in NAD HEENT: head is normocephalic, atraumatic.  Sclera are noninjected.  Ears and nose without any masses or lesions.  Mouth is pink and moist, scattered facial abrasions Heart: regular, rate, and rhythm.  Normal s1,s2. No obvious murmurs, gallops, or rubs noted.  Palpable radial and pedal pulses bilaterally Lungs: CTAB, no wheezes, rhonchi, or rales noted.  Respiratory effort non-labored, chest tubes in place on left side.  IS to 750 yesterday but still at 500 today Abd: soft, NT/ND, +BS, no masses, hernias, or organomegaly MS: all 4 extremities are symmetrical with no cyanosis,  clubbing, or edema. Skin: warm and dry with no masses, lesions, or rashes Psych: A&Ox3 with an appropriate affect.  CT: No definite air leak Blood level at in the pleura-vac canister - 89mL/24 hour per chart   Assessment/Plan: Stepped on by horse  R rib FX 1,4/L rib Fx 8-12 with lg PTX - CT mgt per TCTS, IS goal to 1000 today Left hemothorax with active bleed from IC - s/p Left thoracotomy/bronchoscopy Tyrone Sage - 01/29/14) Mandible FX, chin lac - panorex, ABX, non-chew diet per Dr. Chales Salmon  ABL anemia -- Hgb 9.0, recheck tomorrow FEN - Soft diet, increase oxyIR range, add NSAID  VTE - SCD's, Lovenox  Dispo - CT, possibly transfer to floor if okay with Dr. Waynetta Pean, PA-C Pager: [phone] General Trauma PA Pager: [phone]   01/31/2014

## 2014-01-31 NOTE — Progress Notes (Signed)
Attempted to get patient oob she said she would get up later will continue to monitor

## 2014-01-31 NOTE — Progress Notes (Addendum)
      [address]       [name] [address]             [phone]       2 Days Post-Op Procedure(s) (LRB): Left THORACOTOMY with evacuation of Hematoma and control of Chest Wall bleeding (Left) BRONCHOSCOPY (N/A)  Subjective: Patient awake and alert this am.   Objective: Vital signs in last 24 hours: Temp:  [97.4 F (36.3 C)-98.4 F (36.9 C)] 98.2 F (36.8 C) (09/24 0454) Pulse Rate:  [64-109] 84 (09/24 0454) Cardiac Rhythm:  [-] Normal sinus rhythm (09/24 0454) Resp:  [17-36] 22 (09/24 0454) BP: (88-106)/(49-79) 103/68 mmHg (09/24 0454) SpO2:  [88 %-100 %] 96 % (09/24 0454) Arterial Line BP: (99-100)/(47-50) 99/47 mmHg (09/23 0900)     Intake/Output from previous day: 09/23 0701 - 09/24 0700 In: 2816.5 [P.O.:360; I.V.:2456.5] Out: 3345 [Urine:3295; Chest Tube:50]   Physical Exam:  Cardiovascular: RRR Pulmonary: Clear to auscultation on right and diminished at left base; no rales, wheezes, or rhonchi. Abdomen: Soft, non tender, bowel sounds present. Extremities: No lower extremity edema. Wounds: Dressing is clean and dry.   Chest Tubes: to water seal, minor tidling with cough  Lab Results: CBC: Recent Labs  01/30/14 0450 01/31/14 0435  WBC 10.2 10.1  HGB 9.5* 9.0*  HCT 26.6* 26.2*  PLT 159 241   BMET:  Recent Labs  01/30/14 0640 01/31/14 0435  NA 134* 141  K 4.1 4.2  CL 103 109  CO2 25 24  GLUCOSE 160* 103*  BUN 10 7  CREATININE 0.56 0.52  CALCIUM 7.5* 7.6*    PT/INR:  Recent Labs  01/29/14 1500  LABPROT 15.0  INR 1.18   ABG:  INR: Will add last result for INR, ABG once components are confirmed Will add last 4 CBG results once components are confirmed  Assessment/Plan:  1. CV - SR 2.  Pulmonary - Chest tubes with 50 cc output last 24 hours? Chest tubes are to water seal. There is minor tidling with cough but no true air leak. CXR this am is stable (appears to show NO pneumothorax, atelectasis at bases). Likely  remove one chest tube.Encourage incentive spirometer and flutter valve. Check CXR in am 3. ABL anemia-H and H stable 9 and 26.2  ZIMMERMAN,DONIELLE MPA-C 01/31/2014,7:32 AM  Anterior chest tube out today I have seen and examined Chad Cordial and agree with the above assessment  and plan.  Delight Ovens MD Beeper [phone] Office [phone] 01/31/2014 5:05 PM

## 2014-01-31 NOTE — Clinical Social Work Psychosocial (Signed)
Clinical Social Work Department BRIEF PSYCHOSOCIAL ASSESSMENT 01/31/2014  Patient:  Andrea Shaw, Andrea Shaw     Account Number:  [id number]     Admit date:  01/26/2014  Clinical Social Worker:  Marciano Sequin  Date/Time:  01/31/2014 12:48 PM  Referred by:  RN  Date Referred:  01/31/2014 Referred for  Other - See comment   Other Referral:   Trauma   Interview type:  Patient Other interview type:    PSYCHOSOCIAL DATA Living Status:  HUSBAND Admitted from facility:   Level of care:   Primary support name:  Luiz Blare Primary support relationship to patient:  SPOUSE Degree of support available:   strong    CURRENT CONCERNS  Other Concerns:    SOCIAL WORK ASSESSMENT / PLAN CSW met the pt at the bedside. CSW introduce self and purpose of visit. Pt reported with a calm mood and normal affect. CSW and pt discussed the nature of the incident. CSW provided emotional support to the pt. Pt reported that she feel confortable returning home. Pt denied feelings of stress. At this time no addition need are idenified CSW will sign off.   Assessment/plan status:  No Further Intervention Required Other assessment/ plan:   Information/referral to community resources:   N/A    PATIENT'S/FAMILY'S RESPONSE TO PLAN OF CARE:  Greta Doom, MSW, [place]

## 2014-01-31 NOTE — Progress Notes (Signed)
UR completed.  Therapies not recommending any HH at d/c. Will continue to follow.   Carlyle Lipa, RN BSN MHA CCM Trauma/Neuro ICU Case Manager [phone]

## 2014-01-31 NOTE — Progress Notes (Signed)
Re-order panorex. Looks good. Patient examined and I agree with the assessment and plan  Violeta Gelinas, MD, MPH, FACS Trauma: [phone] General Surgery: [phone]  01/31/2014 3:08 PM

## 2014-02-01 ENCOUNTER — Inpatient Hospital Stay (HOSPITAL_COMMUNITY): Payer: Managed Care, Other (non HMO)

## 2014-02-01 ENCOUNTER — Encounter (HOSPITAL_COMMUNITY): Payer: Self-pay | Admitting: Cardiothoracic Surgery

## 2014-02-01 LAB — BLOOD PRODUCT ORDER (VERBAL) VERIFICATION

## 2014-02-01 LAB — CBC
HCT: 24.3 % — ABNORMAL LOW (ref 36.0–46.0)
Hemoglobin: 8.1 g/dL — ABNORMAL LOW (ref 12.0–15.0)
MCH: 28.6 pg (ref 26.0–34.0)
MCHC: 33.3 g/dL (ref 30.0–36.0)
MCV: 85.9 fL (ref 78.0–100.0)
PLATELETS: 253 10*3/uL (ref 150–400)
RBC: 2.83 MIL/uL — AB (ref 3.87–5.11)
RDW: 16 % — ABNORMAL HIGH (ref 11.5–15.5)
WBC: 8 10*3/uL (ref 4.0–10.5)

## 2014-02-01 MED ORDER — POLYETHYLENE GLYCOL 3350 17 G PO PACK
17.0000 g | PACK | Freq: Two times a day (BID) | ORAL | Status: DC
  Administered 2014-02-01: 17 g via ORAL
  Filled 2014-02-01 (×8): qty 1

## 2014-02-01 NOTE — Progress Notes (Signed)
Patient examined and I agree with the assessment and plan Final CT coming out now. Encouraged pulmonary toilet. Still not quite 750 on IS. Using flutter valve. Violeta Gelinas, MD, MPH, FACS Trauma: [phone] General Surgery: [phone]  02/01/2014 10:16 AM

## 2014-02-01 NOTE — Progress Notes (Signed)
Chest tube removed per MD orders. Procedure was explained to pt prior to beginning, pt verbalized understanding. Chest tube was removed, no s/s of acute distress noted, pt tolerated procedure well. Pt instructed to lay flat for 30 minutes and to notify the nurse if she begins to have increased SOB. SpO2 98% before and after chest tube removal

## 2014-02-01 NOTE — Progress Notes (Signed)
Physical Therapy Treatment Patient Details Name: OZELL FERRERA MRN: [id number] DOB: 21-Jun-1955 Today's Date: 23-Feb-2014    History of Present Illness 58 y.o. female admitted to [facility] on 01/26/14 after being kicked by a horse.  She had resultant multiple bil rib fxs (L side more than right), L PTX with L chest tube and mandible fx with multiple lacerations on her chin.  Pt underwent emergency surgery on 9/22 for thoracotomy and evacuation of hematoma. Pt with no significant PMHx.     PT Comments    Treatment limited by fatigue after getting washed up prior to PT.  Feel will need extra time to complete tasks at home and would benefit from energy conservation education.  Follow Up Recommendations  Supervision for mobility/OOB;No PT follow up     Equipment Recommendations  None recommended by PT    Recommendations for Other Services       Precautions / Restrictions Restrictions Weight Bearing Restrictions: No    Mobility  Bed Mobility   Bed Mobility: Sit to Supine       Sit to supine: Supervision   General bed mobility comments: increased time   Transfers       Sit to Stand: Supervision         General transfer comment: for safety due to feels weak  Ambulation/Gait Ambulation/Gait assistance: Supervision Ambulation Distance (Feet): 150 Feet Assistive device: None     Gait velocity interpretation: <1.8 ft/sec, indicative of risk for recurrent falls General Gait Details: slow and holding gown up, knees flexed and weak legs throughout   Stairs            Wheelchair Mobility    Modified Rankin (Stroke Patients Only)       Balance             Standing balance-Leahy Scale: Fair Standing balance comment: steady in static stance, feels weak with ambulation                    Cognition Arousal/Alertness: Awake/alert Behavior During Therapy: WFL for tasks assessed/performed Overall Cognitive Status: Within Functional Limits for tasks  assessed                      Exercises      General Comments        Pertinent Vitals/Pain Pain Assessment: No/denies pain    Home Living                      Prior Function            PT Goals (current goals can now be found in the care plan section) Progress towards PT goals: Progressing toward goals    Frequency  Min 3X/week    PT Plan Current plan remains appropriate    Co-evaluation             End of Session   Activity Tolerance: Patient limited by fatigue Patient left: in bed;with call bell/phone within reach;with family/visitor present     Time: 4098-1191 PT Time Calculation (min): 17 min  Charges:  $Gait Training: 8-22 mins                    G Codes:      Arika Mainer,CYNDI 02-23-2014, 5:02 PM Sheran Lawless, PT [phone] 23-Feb-2014

## 2014-02-01 NOTE — Progress Notes (Addendum)
Received orders to transfer to 6N, pt and family member aware of transfer. VS stable. Attempted to call report to RN on 6N, awaiting call back.  Report called to RN on 6N, VS stable. Will transfer pt via wheelchair.

## 2014-02-01 NOTE — Discharge Instructions (Addendum)
Thoracotomy, Care After °Refer to this sheet in the next few weeks. These instructions provide you with information on caring for yourself after your procedure. Your health care provider may also give you more specific instructions. Your treatment has been planned according to current medical practices, but problems sometimes occur. Call your health care provider if you have any problems or questions after your procedure. °WHAT TO EXPECT AFTER YOUR PROCEDURE °After your procedure, it is typical to have the following sensations: °· You may feel pain at the incision site. °· You may be constipated from the pain medicine given and the change in your level of activity. °· You may feel extremely tired. °HOME CARE INSTRUCTIONS °· Take over-the-counter or prescription medicines for pain, discomfort, or fever only as directed by your health care provider. It is very important to take pain relieving medicine before your pain becomes severe. You will be able to breathe and cough more comfortably if your pain is well controlled. °· Take deep breaths. Deep breathing helps to keep your lungs inflated and protects against a lung infection (pneumonia). °· Cough frequently. Even though coughing may cause discomfort, coughing is important to clear mucus (phlegm) and expand your lungs. Coughing helps prevent pneumonia. If it hurts to cough, hold a pillow against your chest when you cough. This may help with the discomfort. °· Continue to use an incentive spirometer as directed. The use of an incentive spirometer helps to keep your lungs inflated and protects against pneumonia. °· Change the bandages over your incision as needed or as directed by your health care provider. °· Remove the bandages over your chest tube site as directed by your health care provider. °· Resume your normal diet as directed. It is important to have adequate protein, calories, vitamins, and minerals to promote healing. °· Prevent constipation. °¨ Eat  high-fiber foods such as whole grain cereals and breads, brown rice, beans, and fresh fruits and vegetables. °¨ Drink enough water and fluids to keep your urine clear or pale yellow. Avoid drinking beverages containing caffeine. Beverages containing caffeine can cause dehydration and harden your stool. °¨ Talk to your health care provider about taking a stool softener or laxative. °· Avoid lifting until you are instructed otherwise. °· Do not drive until directed by your health care provider.  Do not drive while taking pain medicines (narcotics). °· Do not bathe, swim, or use a hot tub until directed by your health care provider. You may shower instead. Gently wash the area of your incision with water and soap as directed. Do not use anything else to clean your incision except as directed by your health care provider. °· Do not use any tobacco products including cigarettes, chewing tobacco, or electronic cigarettes. °· Avoid secondhand smoke. °· Schedule an appointment for stitch (suture) or staple removal as directed. °· Schedule and attend all follow-up visits as directed by your health care provider. It is important to keep all your appointments. °· Participate in pulmonary rehabilitation as directed by your health care provider. °· Do not travel by airplane for 2 weeks after your chest tube is removed. °SEEK MEDICAL CARE IF: °· You are bleeding from your wounds. °· Your heartbeat seems irregular. °· You have redness, swelling, or increasing pain in the wounds. °· There is pus coming from your wounds. °· There is a bad smell coming from the wound or dressing. °· You have a fever or chills. °· You have nausea or are vomiting. °· You have muscle aches. °SEEK   IMMEDIATE MEDICAL CARE IF:  You have a rash.  You have difficulty breathing.  You have a reaction or side effect to medicines given.  You have persistent nausea.  You have lightheadedness or feel faint.  You have shortness of breath or chest  pain.  You have persistent pain. Document Released: 10/09/2010 Document Revised: 05/01/2013 Document Reviewed: 12/13/2012 [employer] Patient Information 2015 [place], [place]. This information is not intended to replace advice given to you by your health care provider. Make sure you discuss any questions you have with your health care provider.   Pneumothorax A pneumothorax, commonly called a collapsed lung, is a condition in which air leaks from a lung and builds up in the space between the lung and the chest wall (pleural space). The air in a pneumothorax is trapped outside the lung and takes up space, preventing the lung from fully expanding. This is a condition that usually occurs suddenly. The buildup of air may be small or large. A small pneumothorax may go away on its own. When a pneumothorax is larger, it will often require medical treatment and hospitalization.  CAUSES  A pneumothorax can sometimes happen quickly with no apparent cause. People with underlying lung problems, particularly COPD or emphysema, are at higher risk of pneumothorax. However, pneumothorax can happen quickly even in people with no prior known lung problems. Trauma, surgery, medical procedures, or injury to the chest wall can also cause a pneumothorax. SIGNS AND SYMPTOMS  Sometimes a pneumothorax will have no symptoms. When symptoms are present, they can include:  Chest pain.  Shortness of breath.  Increased rate of breathing.  Bluish color to your lips or skin (cyanosis). DIAGNOSIS  Pneumothorax is usually diagnosed by a chest X-ray or chest CT scan. Your health care provider will also take a medical history and perform a physical exam to determine why you may have a pneumothorax. TREATMENT  A small pneumothorax may go away on its own without treatment. Extra oxygen can sometimes help a small pneumothorax go away more quickly. For a larger pneumothorax or a pneumothorax that is causing symptoms, a procedure is  usually needed to drain the air.In some cases, the health care provider may drain the air using a needle. In other cases, a chest tube may be inserted into the pleural space. A chest tube is a small tube placed between the ribs and into the pleural space. This removes the extra air and allows the lung to expand back to its normal size. A large pneumothorax will usually require a hospital stay. If there is ongoing air leakage into the pleural space, then the chest tube may need to remain in place for several days until the air leak has healed. In some cases, surgery may be needed.  HOME CARE INSTRUCTIONS   Only take over-the-counter or prescription medicines as directed by your health care provider.  If a cough or pain makes it difficult for you to sleep at night, try sleeping in a semi-upright position in a recliner or by using 2 or 3 pillows.  Rest and limit activity as directed by your health care provider.  If you had a chest tube and it was removed, ask your health care provider when it is okay to remove the dressing. Until your health care provider says you can remove the dressing, do not allow it to get wet.  Do not smoke. Smoking is a risk factor for pneumothorax.  Do not fly in an airplane or scuba dive until your health  care provider says it is okay.  Follow up with your health care provider as directed. SEEK IMMEDIATE MEDICAL CARE IF:   You have increasing chest pain or shortness of breath.  You have a cough that is not controlled with suppressants.  You begin coughing up blood.  You have pain that is getting worse or is not controlled with medicines.  You cough up thick, discolored mucus (sputum) that is yellow to green in color.  You have redness, increasing pain, or discharge at the site where a chest tube had been in place (if your pneumothorax was treated with a chest tube).  The site where your chest tube was located opens up.  You feel air coming out of the site  where the chest tube was placed.  You have a fever or persistent symptoms for more than 2-3 days.  You have a fever and your symptoms suddenly get worse. MAKE SURE YOU:   Understand these instructions.  Will watch your condition.  Will get help right away if you are not doing well or get worse. Document Released: 04/26/2005 Document Revised: 02/14/2013 Document Reviewed: 11/23/2012 [facility] Patient Information 2015 [place], [place]. This information is not intended to replace advice given to you by your health care provider. Make sure you discuss any questions you have with your health care provider.  Soft-Food Meal Plan A soft-food meal plan includes foods that are safe and easy to swallow. This meal plan typically is used:  If you are having trouble chewing or swallowing foods.  As a transition meal plan after only having had liquid meals for a long period. WHAT DO I NEED TO KNOW ABOUT THE SOFT-FOOD MEAL PLAN? A soft-food meal plan includes tender foods that are soft and easy to chew and swallow. In most cases, bite-sized pieces of food are easier to swallow. A bite-sized piece is about  inch or smaller. Foods in this plan do not need to be ground or pureed. Foods that are very hard, crunchy, or sticky should be avoided. Also, breads, cereals, yogurts, and desserts with nuts, seeds, or fruits should be avoided. WHAT FOODS CAN I EAT? Grains Rice and wild rice. Moist bread, dressing, pasta, and noodles. Well-moistened dry or cooked cereals, such as farina (cooked wheat cereal), oatmeal, or grits. Biscuits, breads, muffins, pancakes, and waffles that have been well moistened. Vegetables Shredded lettuce. Cooked, tender vegetables, including potatoes without skins. Vegetable juices. Broths or creamed soups made with vegetables that are not stringy or chewy. Strained tomatoes (without seeds). Fruits Canned or well-cooked fruits. Soft (ripe), peeled fresh fruits, such as peaches,  nectarines, kiwi, cantaloupe, honeydew melon, and watermelon (without seeds). Soft berries with small seeds, such as strawberries. Fruit juices (without pulp). Meats and Other Protein Sources Moist, tender, lean beef. Mutton. Lamb. Veal. Chicken. [place]. Liver. Ham. Fish without bones. Eggs. Dairy Milk, milk drinks, and cream. Plain cream cheese and cottage cheese. Plain yogurt. Sweets/Desserts Flavored gelatin desserts. Custard. Plain ice cream, frozen yogurt, sherbet, milk shakes, and malts. Plain cakes and cookies. Plain hard candy.  Other Butter, margarine (without trans fat), and cooking oils. Mayonnaise. Cream sauces. Mild spices, salt, and sugar. Syrup, molasses, honey, and jelly. The items listed above may not be a complete list of recommended foods or beverages. Contact your dietitian for more options. WHAT FOODS ARE NOT RECOMMENDED? Grains Dry bread, toast, crackers that have not been moistened. Coarse or dry cereals, such as bran, granola, and shredded wheat. Tough or chewy crusty breads, such as  French bread or baguettes. Vegetables Corn. Raw vegetables except shredded lettuce. Cooked vegetables that are tough or stringy. Tough, crisp, fried potatoes and potato skins. Fruits Fresh fruits with skins or seeds or both, such as apples, pears, or grapes. Stringy, high-pulp fruits, such as papaya, pineapple, coconut, or mango. Fruit leather, fruit roll-ups, and all dried fruits. Meats and Other Protein Sources Sausages and hot dogs. Meats with gristle. Fish with bones. Nuts, seeds, and chunky peanut or other nut butters. Sweets/Desserts Cakes or cookies that are very dry or chewy.  The items listed above may not be a complete list of foods and beverages to avoid. Contact your dietitian for more information. Document Released: 08/03/2007 Document Revised: 05/01/2013 Document Reviewed: 03/23/2013 [facility] Patient Information 2015 [place], [place]. This information is not intended to replace  advice given to you by your health care provider. Make sure you discuss any questions you have with your health care provider.  Thoracotomy, Care After Refer to this sheet in the next few weeks. These instructions provide you with information on caring for yourself after your procedure. Your health care provider may also give you more specific instructions. Your treatment has been planned according to current medical practices, but problems sometimes occur. Call your health care provider if you have any problems or questions after your procedure. WHAT TO EXPECT AFTER YOUR PROCEDURE After your procedure, it is typical to have the following sensations:  You may feel pain at the incision site.  You may be constipated from the pain medicine given and the change in your level of activity.  You may feel extremely tired. HOME CARE INSTRUCTIONS  Take over-the-counter or prescription medicines for pain, discomfort, or fever only as directed by your health care provider. It is very important to take pain relieving medicine before your pain becomes severe. You will be able to breathe and cough more comfortably if your pain is well controlled.  Take deep breaths. Deep breathing helps to keep your lungs inflated and protects against a lung infection (pneumonia).  Cough frequently. Even though coughing may cause discomfort, coughing is important to clear mucus (phlegm) and expand your lungs. Coughing helps prevent pneumonia. If it hurts to cough, hold a pillow against your chest when you cough. This may help with the discomfort.  Continue to use an incentive spirometer as directed. The use of an incentive spirometer helps to keep your lungs inflated and protects against pneumonia.  Change the bandages over your incision as needed or as directed by your health care provider.  Remove the bandages over your chest tube site as directed by your health care provider.  Resume your normal diet as directed. It is  important to have adequate protein, calories, vitamins, and minerals to promote healing.  Prevent constipation.  Eat high-fiber foods such as whole grain cereals and breads, brown rice, beans, and fresh fruits and vegetables.  Drink enough water and fluids to keep your urine clear or pale yellow. Avoid drinking beverages containing caffeine. Beverages containing caffeine can cause dehydration and harden your stool.  Talk to your health care provider about taking a stool softener or laxative.  Avoid lifting until you are instructed otherwise.  Do not drive until directed by your health care provider.  Do not drive while taking pain medicines (narcotics).  Do not bathe, swim, or use a hot tub until directed by your health care provider. You may shower instead. Gently wash the area of your incision with water and soap as directed. Do not  use anything else to clean your incision except as directed by your health care provider.  Do not use any tobacco products including cigarettes, chewing tobacco, or electronic cigarettes.  Avoid secondhand smoke.  Schedule an appointment for stitch (suture) or staple removal as directed.  Schedule and attend all follow-up visits as directed by your health care provider. It is important to keep all your appointments.  Participate in pulmonary rehabilitation as directed by your health care provider.  Do not travel by airplane for 2 weeks after your chest tube is removed. SEEK MEDICAL CARE IF:  You are bleeding from your wounds.  Your heartbeat seems irregular.  You have redness, swelling, or increasing pain in the wounds.  There is pus coming from your wounds.  There is a bad smell coming from the wound or dressing.  You have a fever or chills.  You have nausea or are vomiting.  You have muscle aches. SEEK IMMEDIATE MEDICAL CARE IF:  You have a rash.  You have difficulty breathing.  You have a reaction or side effect to medicines  given.  You have persistent nausea.  You have lightheadedness or feel faint.  You have shortness of breath or chest pain.  You have persistent pain. Document Released: 10/09/2010 Document Revised: 05/01/2013 Document Reviewed: 12/13/2012 [facility] Patient Information 2015 [place], [place]. This information is not intended to replace advice given to you by your health care provider. Make sure you discuss any questions you have with your health care provider.

## 2014-02-01 NOTE — Progress Notes (Addendum)
      [address]       [name] [address]             [phone]       3 Days Post-Op Procedure(s) (LRB): Left THORACOTOMY with evacuation of Hematoma and control of Chest Wall bleeding (Left) BRONCHOSCOPY (N/A)  Subjective: Patient feeling better this am.  Objective: Vital signs in last 24 hours: Temp:  [97.8 F (36.6 C)-98.4 F (36.9 C)] 98.4 F (36.9 C) (09/25 0527) Pulse Rate:  [73] 73 (09/25 0527) Cardiac Rhythm:  [-] Normal sinus rhythm (09/24 1935) Resp:  [18-22] 20 (09/25 0527) BP: (106-116)/(63-67) 106/63 mmHg (09/25 0527) SpO2:  [96 %-98 %] 97 % (09/25 0527)     Intake/Output from previous day: 09/24 0701 - 09/25 0700 In: 920 [P.O.:720; I.V.:200] Out: 100 [Chest Tube:100]   Physical Exam:  Cardiovascular: RRR Pulmonary: Clear to auscultation on right and diminished at left base; no rales, wheezes, or rhonchi. Abdomen: Soft, non tender, bowel sounds present. Extremities: No lower extremity edema. Wounds: Dressing is clean and dry.   Chest Tube: to water seal, no air leak  Lab Results: CBC:  Recent Labs  01/31/14 0435 02/01/14 0500  WBC 10.1 8.0  HGB 9.0* 8.1*  HCT 26.2* 24.3*  PLT 241 253   BMET:   Recent Labs  01/30/14 0640 01/31/14 0435  NA 134* 141  K 4.1 4.2  CL 103 109  CO2 25 24  GLUCOSE 160* 103*  BUN 10 7  CREATININE 0.56 0.52  CALCIUM 7.5* 7.6*    PT/INR:   Recent Labs  01/29/14 1500  LABPROT 15.0  INR 1.18   ABG:  INR: Will add last result for INR, ABG once components are confirmed Will add last 4 CBG results once components are confirmed  Assessment/Plan:  1. CV - SR 2.  Pulmonary - Anterior chest tube removed yesterday. Chest tubes with 100 cc output last 24 hours Chest tube is to water seal. There is no  air leak. CXRnot taken yet this am. Encourage incentive spirometer and flutter valve. Check CXR and if stable, remove remaining chest tube. 3. ABL anemia-H and H down to 8.1 and 24.3 4.  Stop PCA after last chest tube removed  ZIMMERMAN,DONIELLE MPA-C 02/01/2014,7:28 AM  Chest xray reviewed, remove remaining chest tube  I have seen and examined Andrea Shaw and agree with the above assessment  and plan.  Delight Ovens MD Beeper [phone] Office [phone] 02/01/2014 9:27 AM

## 2014-02-01 NOTE — Progress Notes (Addendum)
Wasted 390 mcg IV fentanyl with Leta Baptist RN.  Co-sign=Nila Winker Hitt, RN

## 2014-02-01 NOTE — Progress Notes (Signed)
Occupational Therapy Treatment Patient Details Name: Andrea Shaw MRN: [id number] DOB: [birth year] Today's Date: 02/01/2014    History of present illness 58 y.o. female admitted to [facility] on 01/26/14 after being kicked by a horse.  She had resultant multiple bil rib fxs (L side more than right), L PTX with L chest tube and mandible fx with multiple lacerations on her chin.  Pt underwent emergency surgery on 9/22 for thoracotomy and evacuation of hematoma. Pt with no significant PMHx.    OT comments  Pt making excellent progress. Mobilizing with S. Completed education with pt/husband regarding compensatory techniques for ADL. Pt needs 3 in 1 for D/C home. No further OT indicated. Pt safe to D/C home when medically stable.   Follow Up Recommendations  No OT follow up;Supervision/Assistance - 24 hour    Equipment Recommendations  3 in 1 bedside comode    Recommendations for Other Services      Precautions / Restrictions Precautions Precautions: Other (comment) (painful chest/multiple rib fxs) Restrictions Weight Bearing Restrictions: No       Mobility Bed Mobility Overal bed mobility: Needs Assistance Bed Mobility: Rolling;Sidelying to Sit Rolling: Supervision Sidelying to sit: Min assist   Sit to supine: Supervision   General bed mobility comments: Pt has been getting OOB with increased HOB elevated. Practived getting OOB from flat with use of "splinting"  Transfers Overall transfer level: Needs assistance   Transfers: Sit to/from Stand;Stand Pivot Transfers Sit to Stand: Supervision Stand pivot transfers: Supervision       General transfer comment: for safety due to feels weak    Balance                                   ADL                                         General ADL Comments: Educated pt/husband on compensatory techniques for bathing and dressing and set up to decrease pain and increaseindependence       Vision                      Perception     Praxis      Cognition   Behavior During Therapy: [facility] for tasks assessed/performed Overall Cognitive Status: Within Functional Limits for tasks assessed                       Extremity/Trunk Assessment               Exercises     Shoulder Instructions       General Comments      Pertinent Vitals/ Pain       Pain Assessment: 0-10 Pain Score: 4  Pain Location: ribs Pain Descriptors / Indicators: Aching Pain Intervention(s): Limited activity within patient's tolerance;Monitored during session;Repositioned  Home Living                                          Prior Functioning/Environment              Frequency       Progress Toward Goals  OT Goals(current goals can now be found in the care plan  section)  Progress towards OT goals: Progressing toward goals  Acute Rehab OT Goals Patient Stated Goal: to decrease her pain OT Goal Formulation: With patient Time For Goal Achievement: 02/13/14 Potential to Achieve Goals: Good ADL Goals Pt Will Perform Grooming: with supervision;standing Pt Will Perform Upper Body Bathing: with set-up;sitting Pt Will Perform Lower Body Bathing: with min assist;sit to/from stand Pt Will Perform Upper Body Dressing: with set-up;sitting Pt Will Perform Lower Body Dressing: with min assist;sit to/from stand Pt Will Transfer to Toilet: with modified independence;ambulating;bedside commode Pt Will Perform Toileting - Clothing Manipulation and hygiene: with modified independence;sit to/from stand Pt Will Perform Tub/Shower Transfer: Tub transfer;with min guard assist;3 in 1  Plan Discharge plan remains appropriate    Co-evaluation                 End of Session     Activity Tolerance Patient tolerated treatment well   Patient Left in chair;with call bell/phone within reach   Nurse Communication Mobility status        Time: 1730-1746 OT Time  Calculation (min): 16 min  Charges: OT General Charges $OT Visit: 1 Procedure OT Treatments $Self Care/Home Management : 8-22 mins  Xayne Brumbaugh,HILLARY 02/01/2014, 5:57 PM   [facility], OTR/L  [phone] 02/01/2014

## 2014-02-01 NOTE — Progress Notes (Signed)
[facility] Progress Note  3 Days Post-Op  Subjective: Pt feeling stronger every day.  Less pain since one CT was removed.  Appetite improving, tolerating diet.  Ambulating OOB, coughed up phlegm this am.  Using flutter valve, but not using IS much (still below 750 today).     Objective: Vital signs in last 24 hours: Temp:  [97.8 F (36.6 C)-98.4 F (36.9 C)] 98.4 F (36.9 C) (09/25 0527) Pulse Rate:  [73] 73 (09/25 0527) Resp:  [18-22] 20 (09/25 0527) BP: (106-116)/(63-67) 106/63 mmHg (09/25 0527) SpO2:  [96 %-98 %] 97 % (09/25 0527) Last BM Date: 01/26/14  Intake/Output from previous day: 09/24 0701 - 09/25 0700 In: 920 [P.O.:720; I.V.:200] Out: 100 [Chest Tube:100] Intake/Output this shift:    PE: Gen:  Alert, NAD, pleasant Card:  RRR, no M/G/R heard Pulm:  CTA, no W/R/R, minimally improved effort, left chest wall tenderness Abd: Soft, NT/ND, +BS, no HSM Ext:  No erythema, edema, or tenderness   CT: Blood level at  output/24hrs No air leak   Lab Results:   Recent Labs  01/31/14 0435 02/01/14 0500  WBC 10.1 8.0  HGB 9.0* 8.1*  HCT 26.2* 24.3*  PLT 241 253   BMET  Recent Labs  01/30/14 0640 01/31/14 0435  NA 134* 141  K 4.1 4.2  CL 103 109  CO2 25 24  GLUCOSE 160* 103*  BUN 10 7  CREATININE 0.56 0.52  CALCIUM 7.5* 7.6*   PT/INR  Recent Labs  01/29/14 1500  LABPROT 15.0  INR 1.18   CMP     Component Value Date/Time   NA 141 01/31/2014 0435   K 4.2 01/31/2014 0435   CL 109 01/31/2014 0435   CO2 24 01/31/2014 0435   GLUCOSE 103* 01/31/2014 0435   BUN 7 01/31/2014 0435   CREATININE 0.52 01/31/2014 0435   CALCIUM 7.6* 01/31/2014 0435   PROT 4.8* 01/31/2014 0435   ALBUMIN 2.0* 01/31/2014 0435   AST 18 01/31/2014 0435   ALT 14 01/31/2014 0435   ALKPHOS 52 01/31/2014 0435   BILITOT 0.4 01/31/2014 0435   GFRNONAA >90 01/31/2014 0435   GFRAA >90 01/31/2014 0435   Lipase  No results found for this basename: lipase        Studies/Results: Dg Orthopantogram  01/31/2014   CLINICAL DATA:  Stepped on by horse.  EXAM: ORTHOPANTOGRAM/PANORAMIC  COMPARISON:  CT 01/26/2014  FINDINGS: Nondisplaced oblique fracture through the left mandibular angle. No evidence for associated acute fractures.  IMPRESSION: Oblique nondisplaced left mandibular angle fracture.   Electronically Signed   By: Annia Belt M.D.   On: 01/31/2014 17:45   Dg Chest Port 1 View  01/31/2014   CLINICAL DATA:  Status post left VATS  EXAM: PORTABLE CHEST - 1 VIEW  COMPARISON:  01/30/2014  FINDINGS: Cardiac shadow is stable. Two chest tubes are again noted on the left. There has been improvement in the degree of aeration in the left base although some of atelectatic changes persist. No pneumothorax is noted. Persistent right basilar atelectasis is noted. No acute bony abnormality is seen. A right jugular line is noted at the mid superior vena cava.  IMPRESSION: Postoperative change on the left.  Improved aeration in the left base is noted. No pneumothorax is seen.   Electronically Signed   By: Alcide Clever M.D.   On: 01/31/2014 07:47    Anti-infectives: Anti-infectives   Start     Dose/Rate Route Frequency Ordered Stop  01/30/14 0200  vancomycin (VANCOCIN) IVPB 1000 mg/200 mL premix     1,000 mg 200 mL/hr over 60 Minutes Intravenous Every 12 hours 01/29/14 2107 01/30/14 0339   01/29/14 1600  vancomycin (VANCOCIN) IVPB 1000 mg/200 mL premix     1,000 mg 200 mL/hr over 60 Minutes Intravenous  Once 01/29/14 1551 01/29/14 1559   01/29/14 1600  cefUROXime (ZINACEF) 1.5 g in dextrose 5 % 50 mL IVPB  Status:  Discontinued     1.5 g 100 mL/hr over 30 Minutes Intravenous 3 times per day 01/29/14 1552 01/29/14 2009   01/29/14 0900  [MAR Hold]  cephALEXin (KEFLEX) capsule 250 mg  Status:  Discontinued     (On MAR Hold since 01/29/14 1530)   250 mg Oral 3 times daily before meals & bedtime 01/29/14 0806 01/29/14 1803   01/27/14 1800  ceFAZolin (ANCEF)  IVPB 1 g/50 mL premix  Status:  Discontinued     1 g 100 mL/hr over 30 Minutes Intravenous Every 8 hours 01/27/14 1719 01/29/14 0806   01/26/14 2115  clindamycin (CLEOCIN) IVPB 600 mg     600 mg 100 mL/hr over 30 Minutes Intravenous  Once 01/26/14 2101 01/26/14 2209       Assessment/Plan Stepped on by horse  R rib FX 1,4/L rib Fx 8-12 with lg PTX - CT mgt per TCTS, IS goal to 1000 today Left hemothorax with active bleed from IC - s/p Left thoracotomy/bronchoscopy Tyrone Sage - 01/29/14)  Mandible FX, chin lac - panorex ok, ABX, non-chew diet per Dr. Chales Salmon, OP follow up ABL anemia -- IVF KVO, Hgb down to 8.1, recheck tomorrow  FEN - Soft diet, oxyIR & NSAID, D/c PCA after CT removed, bowel regimen -add miralax VTE - SCD's, Lovenox  Dispo - CT, pending CXR this am, transfer out of SDU today to 6N if last CT comes out, may be ready for discharge on Saturday or Sunday if okay with Dr. Tyrone Sage     LOS: 6 days    Aris Georgia 02/01/2014, 7:22 AM Pager: [phone]

## 2014-02-02 ENCOUNTER — Inpatient Hospital Stay (HOSPITAL_COMMUNITY): Payer: Managed Care, Other (non HMO)

## 2014-02-02 LAB — TYPE AND SCREEN
ABO/RH(D): O POS
ANTIBODY SCREEN: NEGATIVE
UNIT DIVISION: 0
UNIT DIVISION: 0
Unit division: 0
Unit division: 0
Unit division: 0
Unit division: 0

## 2014-02-02 LAB — CBC
HCT: 26.9 % — ABNORMAL LOW (ref 36.0–46.0)
Hemoglobin: 9 g/dL — ABNORMAL LOW (ref 12.0–15.0)
MCH: 29.7 pg (ref 26.0–34.0)
MCHC: 33.5 g/dL (ref 30.0–36.0)
MCV: 88.8 fL (ref 78.0–100.0)
Platelets: 293 10*3/uL (ref 150–400)
RBC: 3.03 MIL/uL — AB (ref 3.87–5.11)
RDW: 16 % — AB (ref 11.5–15.5)
WBC: 8.3 10*3/uL (ref 4.0–10.5)

## 2014-02-02 NOTE — Progress Notes (Signed)
Adalis Gatti, MD, MPH, FACS Trauma: [phone] General Surgery: [phone]  

## 2014-02-02 NOTE — Progress Notes (Addendum)
      [address]       [name] [address]             [phone]       4 Days Post-Op Procedure(s) (LRB): Left THORACOTOMY with evacuation of Hematoma and control of Chest Wall bleeding (Left) BRONCHOSCOPY (N/A)  Subjective: Patient continues to get stronger.  Objective: Vital signs in last 24 hours: Temp:  [97.5 F (36.4 C)-98.7 F (37.1 C)] 97.9 F (36.6 C) (09/26 0943) Pulse Rate:  [78-85] 80 (09/26 0943) Cardiac Rhythm:  [-]  Resp:  [16-20] 18 (09/26 0943) BP: (111-119)/(69-78) 117/69 mmHg (09/26 0943) SpO2:  [94 %-100 %] 94 % (09/26 0943)     Intake/Output from previous day: 09/25 0701 - 09/26 0700 In: 690 [P.O.:580; I.V.:110] Out: 3250 [Urine:3250]   Physical Exam:  Cardiovascular: RRR Pulmonary: Clear to auscultation on right and diminished at left base; no rales, wheezes, or rhonchi. Abdomen: Soft, non tender, bowel sounds present. Extremities: No lower extremity edema. Wounds: Clean and dry.  No signs of infection   Lab Results: CBC:  Recent Labs  02/01/14 0500 02/02/14 0434  WBC 8.0 8.3  HGB 8.1* 9.0*  HCT 24.3* 26.9*  PLT 253 293   BMET:   Recent Labs  01/31/14 0435  NA 141  K 4.2  CL 109  CO2 24  GLUCOSE 103*  BUN 7  CREATININE 0.52  CALCIUM 7.6*    PT/INR:  No results found for this basename: LABPROT, INR,  in the last 72 hours ABG:  INR: Will add last result for INR, ABG once components are confirmed Will add last 4 CBG results once components are confirmed  Assessment/Plan:  1. CV - SR 2.  Pulmonary - Both chest tubes have been removed. CXR this am shows no pneumothorax, left rib fractures 6-8, small left pleural effusion and atelectasis. 3. ABL anemia-H and H down to 9 and 26.9 4. Patient inquiring about removal of sutures on face. Per trauma. 5. Hopefully, discharge in am. Per trauma. Follow up with Dr. Tyrone Sage arranged.  ZIMMERMAN,DONIELLE MPA-C 02/02/2014,11:06 AM   Agree with above. CXR  looks stable. I would keep chest tube sutures in for at least 7 days.

## 2014-02-02 NOTE — Progress Notes (Signed)
4 Days Post-Op  Subjective: Chest tubes out No shortness of breath Still having considerable left chest soreness when she tries to move  Objective: Vital signs in last 24 hours: Temp:  [97.5 F (36.4 C)-98.7 F (37.1 C)] 98.1 F (36.7 C) (09/26 0501) Pulse Rate:  [78-85] 81 (09/26 0501) Resp:  [16-20] 20 (09/26 0501) BP: (111-119)/(70-78) 119/70 mmHg (09/26 0501) SpO2:  [95 %-100 %] 98 % (09/26 0501) Last BM Date: 02/01/14  Intake/Output from previous day: 09/25 0701 - 09/26 0700 In: 690 [P.O.:580; I.V.:110] Out: 3250 [Urine:3250] Intake/Output this shift:    General appearance: alert, cooperative and no distress Resp: clear to auscultation bilaterally; no wheezes or rhonchi Chest wall: no tenderness, left sided chest wall tenderness Chest tube sites/ incision OK  Lab Results:   Recent Labs  02/01/14 0500 02/02/14 0434  WBC 8.0 8.3  HGB 8.1* 9.0*  HCT 24.3* 26.9*  PLT 253 293   BMET  Recent Labs  01/31/14 0435  NA 141  K 4.2  CL 109  CO2 24  GLUCOSE 103*  BUN 7  CREATININE 0.52  CALCIUM 7.6*   PT/INR No results found for this basename: LABPROT, INR,  in the last 72 hours ABG No results found for this basename: PHART, PCO2, PO2, HCO3,  in the last 72 hours  Studies/Results: Dg Orthopantogram  01/31/2014   CLINICAL DATA:  Stepped on by horse.  EXAM: ORTHOPANTOGRAM/PANORAMIC  COMPARISON:  CT 01/26/2014  FINDINGS: Nondisplaced oblique fracture through the left mandibular angle. No evidence for associated acute fractures.  IMPRESSION: Oblique nondisplaced left mandibular angle fracture.   Electronically Signed   By: Annia Belt M.D.   On: 01/31/2014 17:45   Dg Chest Port 1 View  02/01/2014   CLINICAL DATA:  Chest tube.  EXAM: PORTABLE CHEST - 1 VIEW  COMPARISON:  Chest x-ray 01/31/2014.  Chest CT 01/26/2014.  FINDINGS: Right IJ line in stable position. 1 of the 2 chest tubes on the left has been removed. One chest tube remains. No pneumothorax. Mediastinum  and hilar structures stable. Heart size is normal. Interim improvement in atelectasis and/or infiltrate left lung base. Small bilateral pleural effusions. Multiple left-sided rib fractures are again noted.  IMPRESSION: 1. Interim removal of one of the left-sided chest tube. One left-sided chest tube remains. No pneumothorax. Right IJ line in stable position. 2. Interval improvement left lower lobe subsegmental atelectasis and or infiltrate. Small bilateral pleural effusions. 3. Multiple left rib fractures again noted.   Electronically Signed   By: Andrea Fus  Shaw   On: 02/01/2014 08:07    Anti-infectives: Anti-infectives   Start     Dose/Rate Route Frequency Ordered Stop   01/30/14 0200  vancomycin (VANCOCIN) IVPB 1000 mg/200 mL premix     1,000 mg 200 mL/hr over 60 Minutes Intravenous Every 12 hours 01/29/14 2107 01/30/14 0339   01/29/14 1600  vancomycin (VANCOCIN) IVPB 1000 mg/200 mL premix     1,000 mg 200 mL/hr over 60 Minutes Intravenous  Once 01/29/14 1551 01/29/14 1559   01/29/14 1600  cefUROXime (ZINACEF) 1.5 g in dextrose 5 % 50 mL IVPB  Status:  Discontinued     1.5 g 100 mL/hr over 30 Minutes Intravenous 3 times per day 01/29/14 1552 01/29/14 2009   01/29/14 0900  [MAR Hold]  cephALEXin (KEFLEX) capsule 250 mg  Status:  Discontinued     (On MAR Hold since 01/29/14 1530)   250 mg Oral 3 times daily before meals & bedtime 01/29/14 0806 01/29/14  1803   01/27/14 1800  ceFAZolin (ANCEF) IVPB 1 g/50 mL premix  Status:  Discontinued     1 g 100 mL/hr over 30 Minutes Intravenous Every 8 hours 01/27/14 1719 01/29/14 0806   01/26/14 2115  clindamycin (CLEOCIN) IVPB 600 mg     600 mg 100 mL/hr over 30 Minutes Intravenous  Once 01/26/14 2101 01/26/14 2209      Assessment/Plan: s/p Procedure(s): Left THORACOTOMY with evacuation of Hematoma and control of Chest Wall bleeding (Left) BRONCHOSCOPY (N/A) Plan for discharge tomorrow Saline lock   LOS: 7 days    Andrea Shaw  K. 02/02/2014

## 2014-02-02 NOTE — Progress Notes (Signed)
Facial sutures removed  

## 2014-02-02 NOTE — Progress Notes (Signed)
Trauma MD notified Andrea Shaw) of today's chest x-ray report. No orders received.

## 2014-02-03 MED ORDER — OXYCODONE HCL 5 MG PO TABS: 5.0000 mg | ORAL_TABLET | ORAL | Status: DC | PRN

## 2014-02-03 NOTE — Discharge Summary (Signed)
Physician Discharge Summary  Patient ID: Andrea Shaw MRN: [id number] DOB/AGE: [birth year] 58 y.o.  Admit date: 01/26/2014 Discharge date: 02/03/2014  Admission Diagnoses:   Fall/trampled by horse  Left PTX  Left rib fracture 02/18/11 posterior  Right rib fractures 1-4  Mandibular fracture without displacement Facial  lacerations  Discharge Diagnoses:  Fall/trampled by horse  Left pneumothorax/hemothorax from fall. Left rib fracture 02/18/11 posterior  Right rib fractures 1-4  Mandibular fracture without displacement Facial  lacerations    Active Problems:   Traumatic hemopneumothorax   Fall from horse   Multiple fractures of ribs of both sides   Acute blood loss anemia   Mandible fracture   Chin laceration   PROCEDURES: Left THORACOTOMY with evacuation of Hematoma and control of Chest Wall bleeding (Left)  BRONCHOSCOPY, 01/29/2014, Delight Ovens, MD    Hospital Course:  This is a new problem. The current episode started today. The problem has been gradually worsening. Associated symptoms include chest pain, headaches and weakness.  Pt stomped by horse on left chest... Complaining of left sided pain and face pain. Pain worse with movement and breathing and better at rest. She was admitted and had issues with pain and on 9/22 suddenly drained 1 liter from her CT developing hypotension. Hemoglobin dropped to 7.8.  She was transferred to the ICU. She was seen by Dr. Tyrone Sage and taken to the OR with: Left hemothorax with active bleeding from chest wall with hemothorax.  She received 2 units of PRBC's. She stabilized and transferred back to the floor.  She was started on PT and OT.  She has made slow progress.  Her incision have been healing nicely.  CT d/ced on 02/01/14.  She made good improvement and was ready for discharge by 02/03/14.   Condition on D/c:  Improved    Follow up as listed below.    Disposition: Discharged home      Discharge Instructions   Call MD for:  persistant nausea and vomiting    Complete by:  As directed      Call MD for:  redness, tenderness, or signs of infection (pain, swelling, redness, odor or green/yellow discharge around incision site)    Complete by:  As directed      Call MD for:  severe uncontrolled pain    Complete by:  As directed      Call MD for:  temperature >100.4    Complete by:  As directed      Diet general    Complete by:  As directed      Driving Restrictions    Complete by:  As directed   Do not drive while taking pain medications     Increase activity slowly    Complete by:  As directed      May shower / Bathe    Complete by:  As directed      May walk up steps    Complete by:  As directed             Medication List         ibuprofen 200 MG tablet  Commonly known as:  ADVIL,MOTRIN  Take 400 mg by mouth every 6 (six) hours as needed for moderate pain.     oxyCODONE 5 MG immediate release tablet  Commonly known as:  Oxy IR/ROXICODONE  Take 1-2 tablets (5-10 mg total) by mouth every 4 (four) hours as needed for severe pain.       Follow-up Information  Follow up with Beatriz Chancellor, MD In 2 weeks. (For post-hospital follow up regarding your mandible (jaw) fracture)    Specialty:  Oral Surgery   Contact information:   [address] [place] [address] [phone]       Follow up with Delight Ovens, MD On 02/21/2014. (Appointment is at 3:15)    Specialty:  Cardiothoracic Surgery   Contact information:   [address] [place] [address] [place] [address] [phone]       Call [facility]. (As needed)    Contact information:   [address] [place] [address] [phone]      Follow up with [facility] On 02/21/2014. (Please get CXR on 02/21/2014 at 2:30 prior to appointment with Dr. Tyrone Sage)    Contact information:   [place]       Follow up with GERHARDT,EDWARD B, MD. Schedule an appointment as soon as possible  for a visit in 1 week.   Specialty:  Cardiothoracic Surgery   Contact information:   [address] [place] [address] [place] [address] [phone]       Signed: Sherrie George 02/03/2014, 11:00 AM

## 2014-02-03 NOTE — Progress Notes (Signed)
Patient ID: Andrea Shaw, female   DOB: [birth year], 58 y.o.   MRN: [id number]   Patient is comfortable, moving better Instructions given by Dr. Dennie Maizes team for follow-up Ready for discharge today.  Andrea Shaw. Andrea Skains, MD, [facility]  General/ Trauma Surgery  02/03/2014 9:08 AM

## 2014-02-03 NOTE — Progress Notes (Signed)
Discharge instructions gone over with patient. Follow up appointments to be made. Home medications gone over. Pain prescription given. Diet, activity, signs and symptoms of infections, and worsening conditions gone over. Patient verbalized who to call and reasons to call 911. Patient demonstrated understanding of medications and verbalized reasons to call the doctor. She and spouse verbalized understanding of instructions.

## 2014-02-04 NOTE — Discharge Summary (Signed)
Wilmon Arms. Corliss Skains, MD, [facility]  General/ Trauma Surgery  02/04/2014 8:32 AM

## 2014-02-04 NOTE — Op Note (Signed)
Andrea Shaw, Andrea Shaw              ACCOUNT NO.:  [id number]  MEDICAL RECORD NO.:  [id number]  LOCATION:  6N25C                        FACILITY:  [facility]  PHYSICIAN:  Sheliah Plane, MD    DATE OF BIRTH:  1955/11/15  DATE OF PROCEDURE:  01/29/2014 DATE OF DISCHARGE:  02/03/2014                              OPERATIVE REPORT   PREOPERATIVE DIAGNOSIS:  Left hemothorax with active bleeding, delayed after trauma to the left chest.  POSTOPERATIVE DIAGNOSIS:  Left hemothorax with active bleeding, delayed after trauma to the left chest with active bleeder from the chest wall.  PROCEDURE PERFORMED:  Bronchoscopy, left thoracotomy, evacuation of hematoma, and control of left chest wall bleeder.  SURGEON:  Sheliah Plane, MD.  FIRST ASSISTANT:  Coral Ceo, PA.  BRIEF HISTORY:  The patient is a 58 year old female who several days prior to being seen in consultation had suffered multiple rib fractures on the left after being stepped on by a horse.  She initially was evaluated by the trauma service.  There was no evidence of splenic or abdominal injury.  She did have a pneumothorax. A left chest tube had been placed.  Initially, the patient had been stable with minimal drainage from her chest tube.  Thoracic Surgery was consulted emergently when she had a sudden drop in hematocrit and marked increase in chest tube output.  On evaluation, the patient was hypotensive with significant bleeding from the chest tube.  Emergency surgical exploration of the chest was recommended to the patient and discussed with her daughter.  The  patient was agreeable and was taken directly from the ICU to the operating room for emergency exploration of left chest.  DESCRIPTION OF PROCEDURE:  Appropriate IVs including central line were placed by Dr. Katrinka Blazing.  A double lumen endotracheal tube was placed. Fiberoptic bronchoscopy was performed after appropriate time-out to confirm the placement of the double-lumen  endotracheal tube.  There was no evidence of blood in the tracheobronchial tree.  The patient was then turned in the lateral decubitus position with the left side.  The left lung was collapsed.  A small left thoracotomy incision was performed. Approximately 1000 mL of clotted blood was evacuated from the chest.  We carefully explored both the lungs and the chest wall and it became obvious that the active bleeding was coming from an intercostal artery associated with a left rib fracture.  The vessel was identified and coagulated stopping the bleeding.  The rib was somewhat displaced and pushed back into anatomic position.  With the patient's emergency status, being brought to the operating room hypotensive, significant bout of bleeding, and a very posterior location of this fracture, it was not felt that attempting at this point to do rib plating would be appropriate.  Two chest tubes with the hemothorax evacuated and bleeding controlled.  The lung easily reinflated.  A Blake drain was placed posteriorly in the left chest and a standard 28 straight chest tube was placed anteriorly.  The ribs were reapproximated with 0 Vicryl suture with small drill holes in the lower rib.  The muscle layers were closed with interrupted 0 Vicryl sutures in layers.  The subcutaneous tissue closed with running 2-0 Vicryl, running  3-0 subcuticular stitch in skin edges.  Dermabond suture was placed.  The patient tolerated the procedure well.  Blood products were administered to the patient prior to coming to the operating room.  Estimated blood loss was hard to evaluate during the procedure as a significant amount of blood was already in the left chest prior to starting.  The sponge and needle count was reported as correct at completion of procedure.  The patient was hemodynamically stable at the completion of the procedure, so she was extubated in the operating room, tolerated this well, and was transferred to  the recovery room for further postoperative care.     Sheliah Plane, MD     EG/MEDQ  D:  02/03/2014  T:  02/04/2014  Job:  [id number]

## 2014-02-09 ENCOUNTER — Other Ambulatory Visit (INDEPENDENT_AMBULATORY_CARE_PROVIDER_SITE_OTHER): Payer: Self-pay | Admitting: Surgery

## 2014-02-18 ENCOUNTER — Ambulatory Visit (INDEPENDENT_AMBULATORY_CARE_PROVIDER_SITE_OTHER): Payer: Self-pay | Admitting: Physician Assistant

## 2014-02-18 ENCOUNTER — Ambulatory Visit
Admission: RE | Admit: 2014-02-18 | Discharge: 2014-02-18 | Disposition: A | Discharge status: Home / Self Care | Payer: Managed Care, Other (non HMO) | Source: Ambulatory Visit | Attending: Cardiothoracic Surgery | Admitting: Cardiothoracic Surgery

## 2014-02-18 ENCOUNTER — Other Ambulatory Visit: Payer: Self-pay | Admitting: *Deleted

## 2014-02-18 ENCOUNTER — Other Ambulatory Visit: Payer: Self-pay | Admitting: Cardiothoracic Surgery

## 2014-02-18 VITALS — BP 117/75 | HR 88 | Resp 20 | Ht 66.5 in | Wt 136.0 lb

## 2014-02-18 DIAGNOSIS — S272XXA Traumatic hemopneumothorax, initial encounter: Secondary | ICD-10-CM

## 2014-02-18 DIAGNOSIS — S272XXD Traumatic hemopneumothorax, subsequent encounter: Secondary | ICD-10-CM

## 2014-02-18 DIAGNOSIS — Z9889 Other specified postprocedural states: Secondary | ICD-10-CM

## 2014-02-18 DIAGNOSIS — J942 Hemothorax: Secondary | ICD-10-CM

## 2014-02-18 MED ORDER — TRAMADOL HCL 50 MG PO TABS: 50.0000 mg | ORAL_TABLET | Freq: Four times a day (QID) | ORAL | Status: AC | PRN

## 2014-02-18 NOTE — Progress Notes (Signed)
[address]       [place] [id number]             [phone]          HPI: Patient returns for routine postoperative follow-up. She had been admitted by the trauma service for rib fractures after being stepped on by a horse.  A chest tube was placed and she was being monitored when she had a sudden drop in her hematocrit with a marked increase in chest tube output. Thoracic surgery was consulted and the patient was taken emergently to the operating room where she underwent a left thoracotomy for drainage of hemothorax on 01/29/2014. Approximately 1000 ml clotted blood was evacuated and a site of bleeding was noted and controlled. The remainder of the patient's postoperative course was generally uneventful and she was discharged home by the trauma service on 02/03/2014 in good condition.  Since hospital discharge, the patient has continued to progress well.  She continues to have some pain, especially with increased activity and at night, but she has run out the Oxycodone she was given at discharge.  She has been taking Ibuprofen, but would like something a little stronger for use at night.  Her breathing has been stable and she is tolerating increased activity.     Current Outpatient Prescriptions  Medication Sig Dispense Refill  . ibuprofen (ADVIL,MOTRIN) 200 MG tablet Take 400 mg by mouth every 6 (six) hours as needed for moderate pain.      . traMADol (ULTRAM) 50 MG tablet Take 1 tablet (50 mg total) by mouth every 6 (six) hours as needed.  30 tablet  0   No current facility-administered medications for this visit.     Physical Exam: BP 117/75 HR 88 Resp 20 Wounds: Thoracotomy incision and chest tube sites are healing well with no erythema or drainage.  Chest tube sutures are removed without difficulty. Heart: regular rate and rhythm Lungs: Clear    Diagnostic Tests: Chest xray: Dg Chest 2 View  02/18/2014   CLINICAL DATA:  Traumatic  pneumohemothorax, subsequent encounter S27.2XXD (ICD-10-CM). History of thoracotomy and hematoma evacuation.  EXAM: CHEST  2 VIEW  COMPARISON:  02/02/2014.  FINDINGS: Trachea is midline. Heart size normal. Pleural parenchymal opacity at the base of the left hemi thorax has improved slightly in the interval. Associated elevation of the left hemidiaphragm and healing left rib fractures. No definite pneumothorax. Mild biapical pleural parenchymal scarring. Linear scar or atelectasis at the right costophrenic angle.  IMPRESSION: 1. Slight improvement in pleural parenchymal opacification at the base of the left hemi thorax, possibly representing developing scarring. 2. Healing left rib fractures. 3. Minimal residual subsegmental atelectasis or scarring at the right costophrenic angle.   Electronically Signed   By: Leanna BattlesMelinda  Blietz M.D.   On: 02/18/2014 14:13       Assessment/Plan: The patient is recovering well from trauma to the left chest leading to an emergent thoracotomy and drainage of hemothorax.  We have discussed her pain medications, and she would like to avoid something as strong as Oxycodone.  I have given her a prescription for Ultram #30 with no refills, and she will continue the ibuprofen during the day.  She may continue to increase her activity as tolerated, but avoid heavy lifting, as her rib fractures are still healing.  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  We will see her back in 3 weeks with a repeat chest x-ray.

## 2014-02-21 ENCOUNTER — Ambulatory Visit: Payer: Managed Care, Other (non HMO) | Admitting: Cardiothoracic Surgery

## 2014-02-21 ENCOUNTER — Ambulatory Visit: Payer: Managed Care, Other (non HMO)

## 2014-03-18 ENCOUNTER — Other Ambulatory Visit: Payer: Self-pay | Admitting: Cardiothoracic Surgery

## 2014-03-18 DIAGNOSIS — S2243XD Multiple fractures of ribs, bilateral, subsequent encounter for fracture with routine healing: Secondary | ICD-10-CM

## 2014-03-21 ENCOUNTER — Encounter: Payer: Self-pay | Admitting: Cardiothoracic Surgery

## 2014-03-21 ENCOUNTER — Ambulatory Visit (INDEPENDENT_AMBULATORY_CARE_PROVIDER_SITE_OTHER): Payer: Self-pay | Admitting: Cardiothoracic Surgery

## 2014-03-21 ENCOUNTER — Ambulatory Visit
Admission: RE | Admit: 2014-03-21 | Discharge: 2014-03-21 | Disposition: A | Discharge status: Home / Self Care | Payer: Managed Care, Other (non HMO) | Source: Ambulatory Visit | Attending: Cardiothoracic Surgery | Admitting: Cardiothoracic Surgery

## 2014-03-21 VITALS — BP 111/78 | HR 73 | Resp 16 | Ht 66.5 in | Wt 134.5 lb

## 2014-03-21 DIAGNOSIS — J942 Hemothorax: Secondary | ICD-10-CM

## 2014-03-21 DIAGNOSIS — Z9889 Other specified postprocedural states: Secondary | ICD-10-CM

## 2014-03-21 DIAGNOSIS — S2243XD Multiple fractures of ribs, bilateral, subsequent encounter for fracture with routine healing: Secondary | ICD-10-CM

## 2014-03-21 NOTE — Progress Notes (Deleted)
       [address]       [place] [id number]             [phone]          HPI: Patient returns for routine postoperative follow-up. She had been admitted by the trauma service for rib fractures after being stepped on by a horse.  A chest tube was placed and she was being monitored when she had a sudden drop in her hematocrit with a marked increase in chest tube output. Thoracic surgery was consulted and the patient was taken emergently to the operating room where she underwent a left thoracotomy for drainage of hemothorax on 01/29/2014. Approximately 1000 ml clotted blood was evacuated and a site of bleeding was noted and controlled. The remainder of the patient's postoperative course was generally uneventful and she was discharged home by the trauma service on 02/03/2014 in good condition.  Since hospital discharge, the patient has continued to progress well.  She continues to have some pain, especially with increased activity and at night, but she has run out the Oxycodone she was given at discharge.  She has been taking Ibuprofen, but would like something a little stronger for use at night.  Her breathing has been stable and she is tolerating increased activity.     Current Outpatient Prescriptions  Medication Sig Dispense Refill  . ibuprofen (ADVIL,MOTRIN) 200 MG tablet Take 400 mg by mouth every 6 (six) hours as needed for moderate pain.    . traMADol (ULTRAM) 50 MG tablet Take 1 tablet (50 mg total) by mouth every 6 (six) hours as needed. 30 tablet 0   No current facility-administered medications for this visit.     Physical Exam: BP 117/75 HR 88 Resp 20 Wounds: Thoracotomy incision and chest tube sites are healing well with no erythema or drainage.  Chest tube sutures are removed without difficulty. Heart: regular rate and rhythm Lungs: Clear    Diagnostic Tests: Chest xray: No results found.     Assessment/Plan: The patient is recovering well  from trauma to the left chest leading to an emergent thoracotomy and drainage of hemothorax.  We have discussed her pain medications, and she would like to avoid something as strong as Oxycodone.  I have given her a prescription for Ultram #30 with no refills, and she will continue the ibuprofen during the day.  She may continue to increase her activity as tolerated, but avoid heavy lifting, as her rib fractures are still healing.  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  We will see her back in 3 weeks with a repeat chest x-ray.

## 2014-03-21 NOTE — Progress Notes (Signed)
[address]       [place] [id number]             [phone]      Chad CordialSandra D Beam Steele Medical Record #[id number] Date of Birth: May 30, 1955  Referring: Axel Filleramirez, Armando, MD Primary Care: No primary care provider on file.  Chief Complaint:   POST OP FOLLOW UP 01/29/2014 PRE-OPERATIVE DIAGNOSIS: Left hemothorax with active bleeding  POST-OPERATIVE DIAGNOSIS: Left hemothorax with active bleeding from chest wall with hemothorax  PROCEDURE: Procedure(s): Left THORACOTOMY with evacuation of Hematoma and control of Chest Wall bleeding (Left) BRONCHOSCOPY (N/A)  SURGEON: Surgeon(s) and Role:  * Delight OvensEdward B Katheryne Gorr, MD - Primary History of Present Illness:     Patient returns for routine postoperative follow-up. She had been admitted by the trauma service for rib fractures after being stepped on by a horse. A chest tube was placed and she was being monitored when she had a sudden drop in her hematocrit with a marked increase in chest tube output. Thoracic surgery was consulted and the patient was taken emergently to the operating room where she underwent a left thoracotomy for drainage of hemothorax on 01/29/2014. Approximately 1000 ml clotted blood was evacuated and a site of bleeding was noted and controlled. The remainder of the patient's postoperative course was generally uneventful and she was discharged home by the trauma service on 02/03/2014 in good condition.  Since hospital discharge, the patient has continued to progress well. She continues to have some pain, especially with increased activity and at night. . Her breathing has been stable and she is tolerating increased activity. 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 She notes she has not had any follow-up concerning her jaw, she notes she cannot open her mouth as much as she could before, there is no popping or clicking with jaw movement. She will make a  follow-up appointment concerning her jaw injury that was arranged at the time of her discharge   No past medical history on file.   History  Smoking status  . Former Smoker  Smokeless tobacco  . Never Used    History  Alcohol Use  . Yes    Comment: ocassionally     Allergies  Allergen Reactions  . Penicillins Rash  . Other Other (See Comments)    [name] causes eye redness    Current Outpatient Prescriptions  Medication Sig Dispense Refill  . ibuprofen (ADVIL,MOTRIN) 200 MG tablet Take 400 mg by mouth every 6 (six) hours as needed for moderate pain.    . traMADol (ULTRAM) 50 MG tablet Take 1 tablet (50 mg total) by mouth every 6 (six) hours as needed. 30 tablet 0   No current facility-administered medications for this visit.       Physical Exam: BP 111/78 mmHg  Pulse 73  Resp 16  Ht 5' 6.5" (1.689 m)  Wt 134 lb 8 oz (61.009 kg)  BMI 21.39 kg/m2  SpO2 95%  General appearance: alert, cooperative and appears stated age Neurologic: intact Heart: regular rate and rhythm, S1, S2 normal, no murmur, click, rub or gallop Lungs: clear to auscultation bilaterally Abdomen: soft, non-tender; bowel sounds normal; no masses,  no organomegaly Extremities: extremities normal, atraumatic, no cyanosis or edema and Homans sign is negative, no sign of DVT Wound: incisions are well-healed   Diagnostic Studies & Laboratory data:     Recent Radiology Findings:   Dg Chest 2 View  03/21/2014   CLINICAL DATA:  Current chest pain, History of multiple rib fractures and previous left traumatic pneumothorax, subsequent encounter  EXAM: CHEST  2 VIEW  COMPARISON:  02/18/2014  FINDINGS: Cardiac shadow is stable.Persistent changes are noted in the left lung base. These are stable from the prior exam. Healing rib fractures are again identified. No recurrent pneumothorax is seen. The right lung remains clear.  IMPRESSION: Persistent chronic changes are noted in the left base likely  representing a degree of scarring. No new focal abnormality is seen. No recurrent pneumothorax is noted.   Electronically Signed   By: Alcide CleverMark  Lukens M.D.   On: 03/21/2014 10:43      Recent Lab Findings: Lab Results  Component Value Date   WBC 8.3 02/02/2014   HGB 9.0* 02/02/2014   HCT 26.9* 02/02/2014   PLT 293 02/02/2014   GLUCOSE 103* 01/31/2014   ALT 14 01/31/2014   AST 18 01/31/2014   NA 141 01/31/2014   K 4.2 01/31/2014   CL 109 01/31/2014   CREATININE 0.52 01/31/2014   BUN 7 01/31/2014   CO2 24 01/31/2014   INR 1.18 01/29/2014      Assessment / Plan:     Status post left chest hemorrhage requiring emergency thoracotomy after crush injury left chest related to horse. Patient will continue to work 20 hours a week for the next 2 weeks then increase to 30 hours a week for the next month and by January 4 return to full work limiting heavy lifting. I'll plan to see her back as necessary       Delight OvensEdward B Aricka Goldberger MD      [address] [place] [id number] Office [phone]   Beeper [phone]  03/21/2014 12:33 PM

## 2015-10-03 IMAGING — CR DG CHEST 1V PORT
1 series · 1 of 1 positions shown · non-contrast
Comparison: CT 01/26/2014

CLINICAL DATA: Pneumothorax following trauma

EXAM:
PORTABLE CHEST - 1 VIEW

[AP]
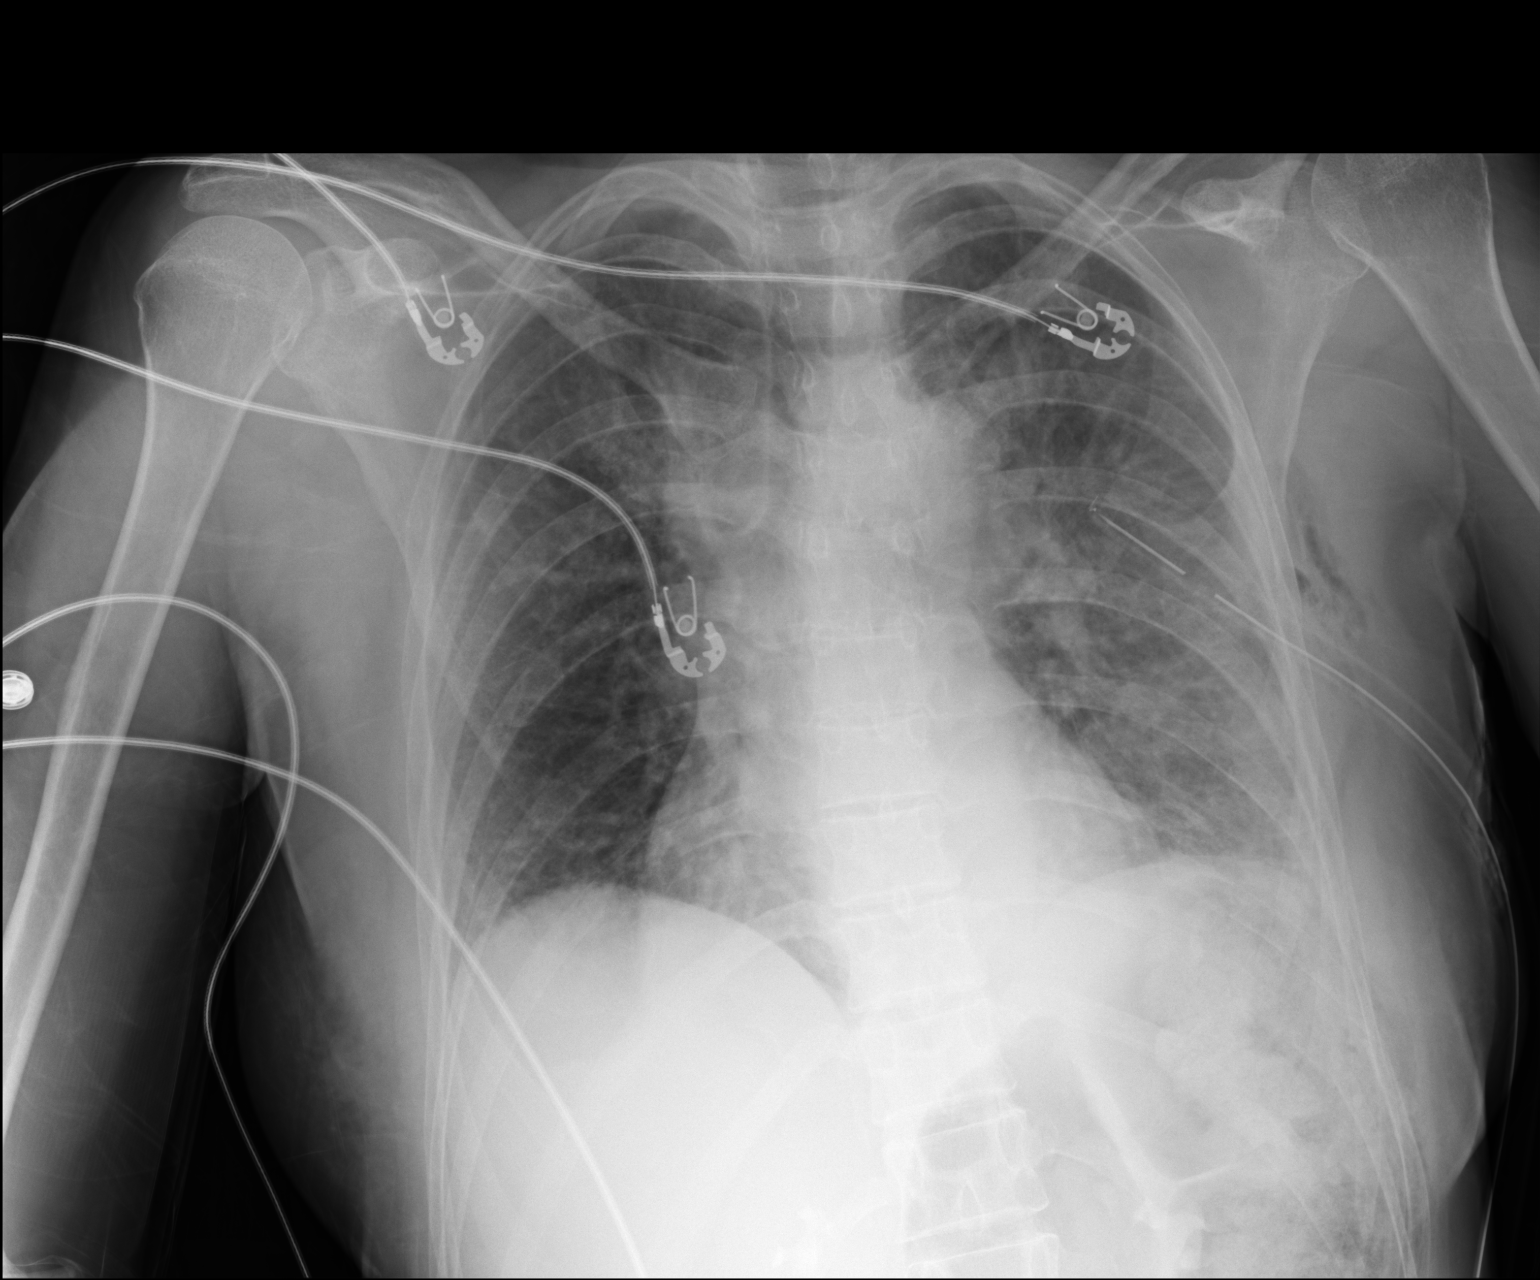

[1 of 1 positions shown; findings below may reference images not displayed]

FINDINGS: Interval placement left-sided chest to. There is interval expansion
of the left lung. The pleural edge is difficult to define at the
left lung apex. Posterior left rib fractures are noted. Pulmonary
contusion at the left lung base noted.
IMPRESSION: 1. Interval expansion of left lung following chest tube placement.
2. Posterior left rib fractures and pulmonary contusion again noted.

## 2015-10-04 IMAGING — CR DG CHEST 1V PORT
1 series · 1 of 1 positions shown · non-contrast
Comparison: 01/26/2014

CLINICAL DATA: Left chest tube.

EXAM:
PORTABLE CHEST - 1 VIEW

[AP]
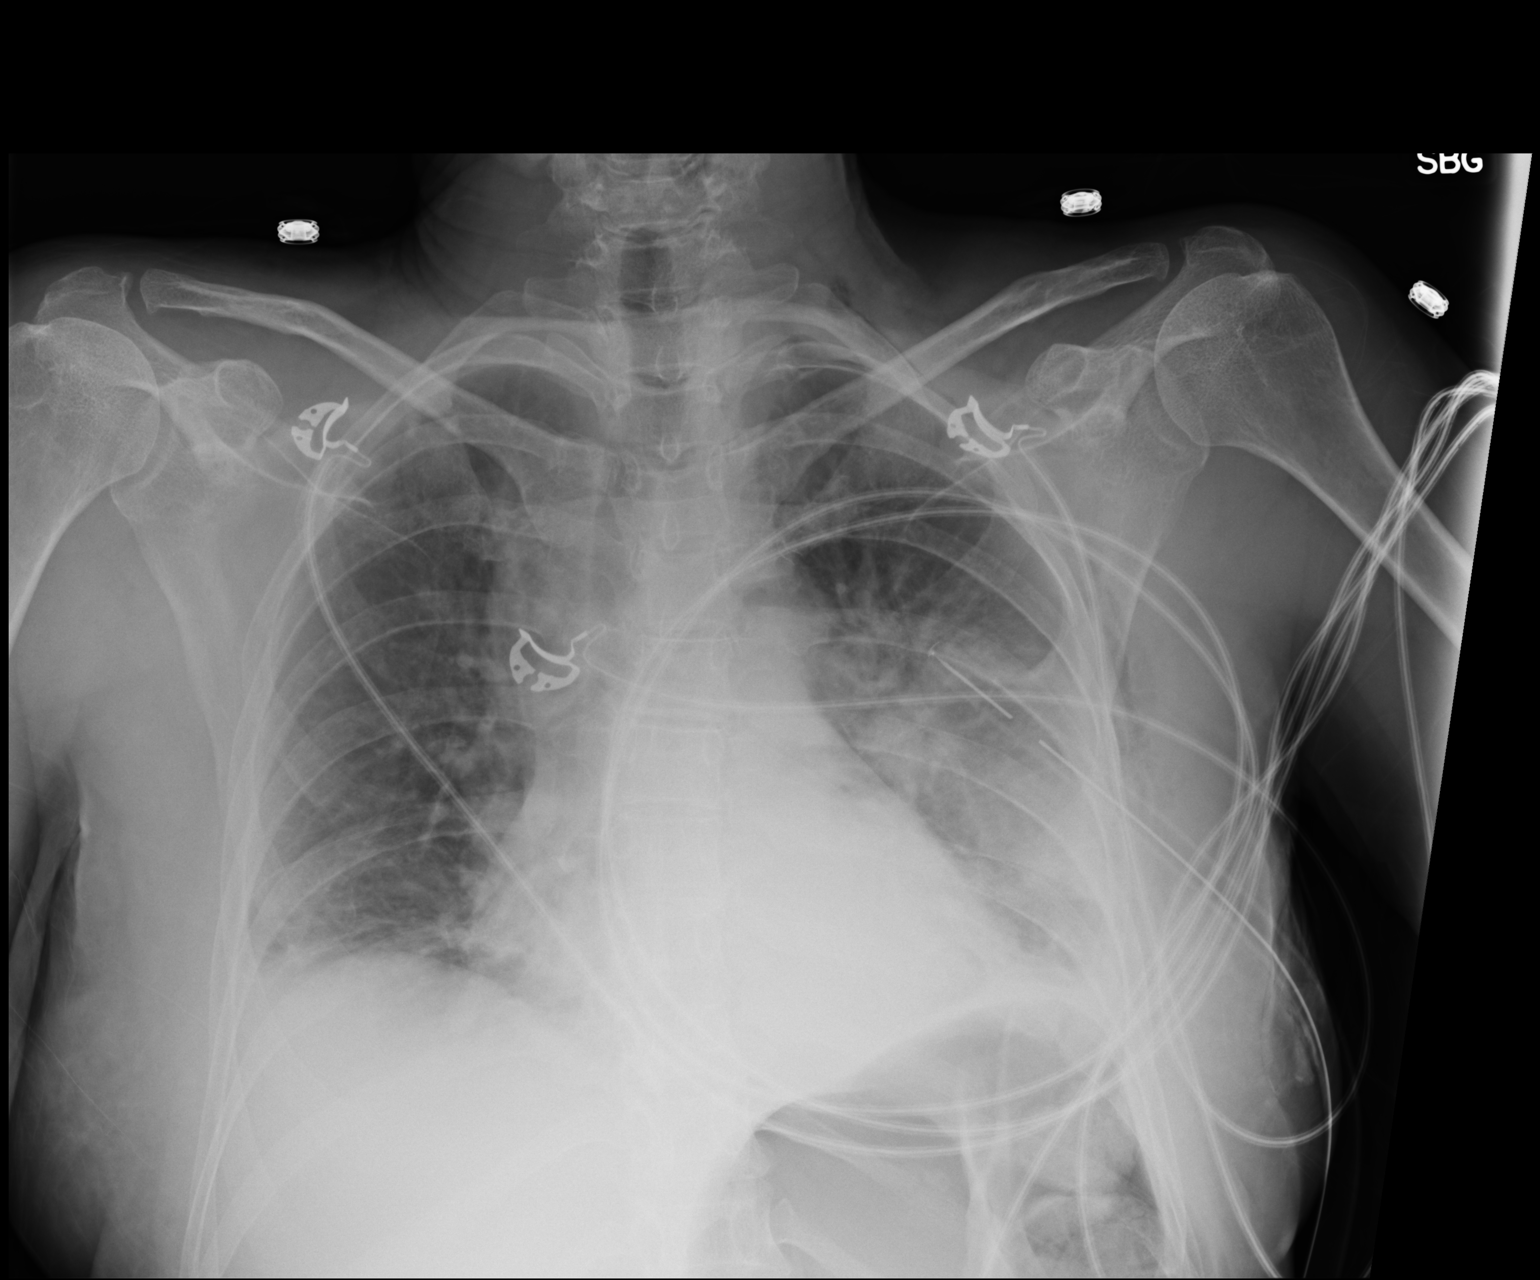

[1 of 1 positions shown; findings below may reference images not displayed]

FINDINGS: Left chest tube is stable. There is subtle evidence of a tiny apical
pneumothorax on the current exam. This is not evident on the
previous day's study. The difference may be technical only.

Opacity in the left mid and lower lung is accentuated by lower lung
volumes on the current exam. Some increased opacity at the left lung
base is noted consistent with atelectasis. Left mid lung opacity may
reflect a combination of atelectasis and contusion.

Small amount of subcutaneous air left overlies the left neck base
and left lateral chest wall, stable.

Mild right lung base atelectasis.

Cardiac silhouette is normal in size. No mediastinal or hilar
masses.
IMPRESSION: 1. Tiny apical pneumothorax is suggested. Chest tube is stable
appearance.
2. Mild increase in lung base atelectasis.
3. Left mid to lower lung opacity is similar to the prior study
allowing for lower lung volumes. This is most likely a combination
atelectasis and contusion.

## 2015-10-05 IMAGING — CR DG CHEST 1V PORT
1 series · 1 of 1 positions shown · non-contrast
Comparison: 01/27/2014

CLINICAL DATA: History of fall and complained of left chest pain.
Patient has a left chest tube.

EXAM:
PORTABLE CHEST - 1 VIEW

[AP]
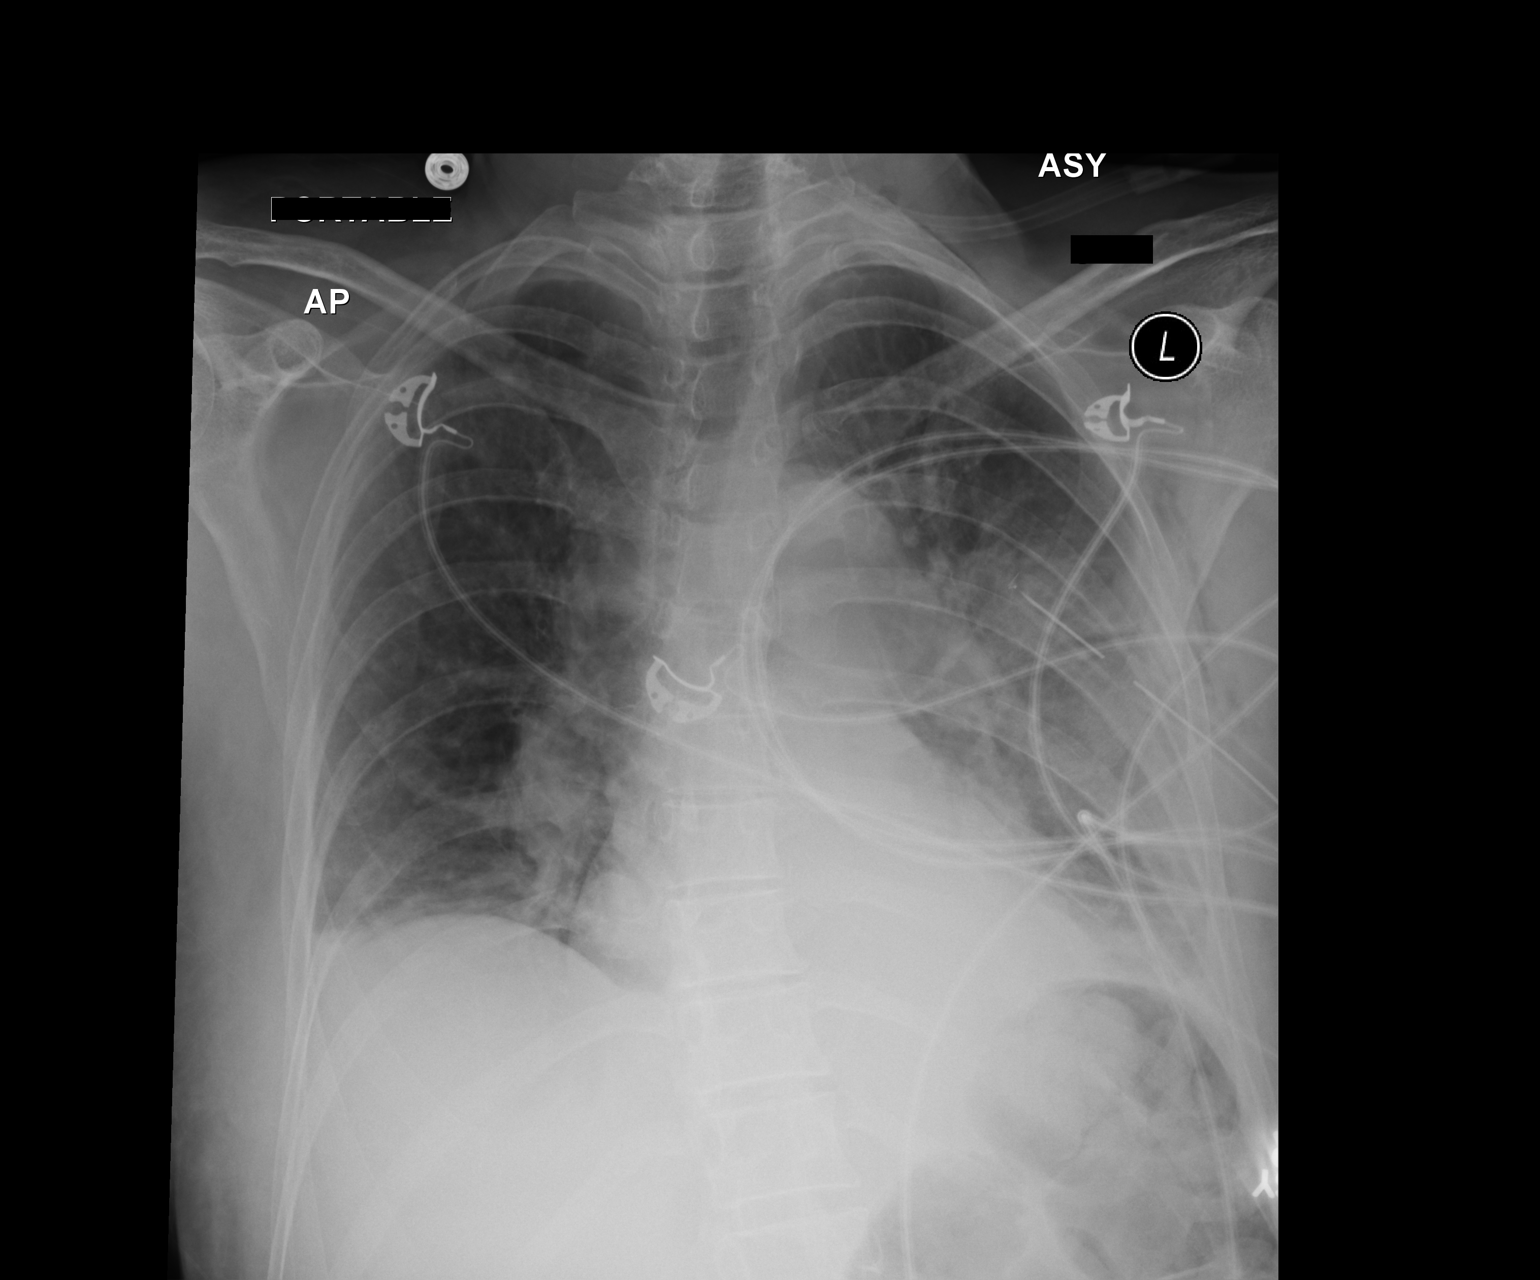

[1 of 1 positions shown; findings below may reference images not displayed]

FINDINGS: The left chest tube is stable in position. There continues to be
subcutaneous gas on the left side of the chest. Question a tiny left
apical pneumothorax. Persistent patchy densities throughout the left
mid and lower lung region. Densities at the right lung base could
represent atelectasis and cannot exclude a small right pleural
effusion. Negative for a right pneumothorax. Heart size is normal.
The trachea is midline.
IMPRESSION: Stable position of the left chest tube without a large pneumothorax.
Question a tiny left apical pneumothorax.

Stable parenchymal densities in the left mid and lower lung region.

Stable right basilar chest densities.

## 2015-10-06 IMAGING — CR DG CHEST 1V PORT
1 series · 1 of 1 positions shown · non-contrast
Comparison: [DATE]/7879 7090 hr

CLINICAL DATA: Status post recent chest surgery, check chest tube
position

EXAM:
PORTABLE CHEST - 1 VIEW

[portable]
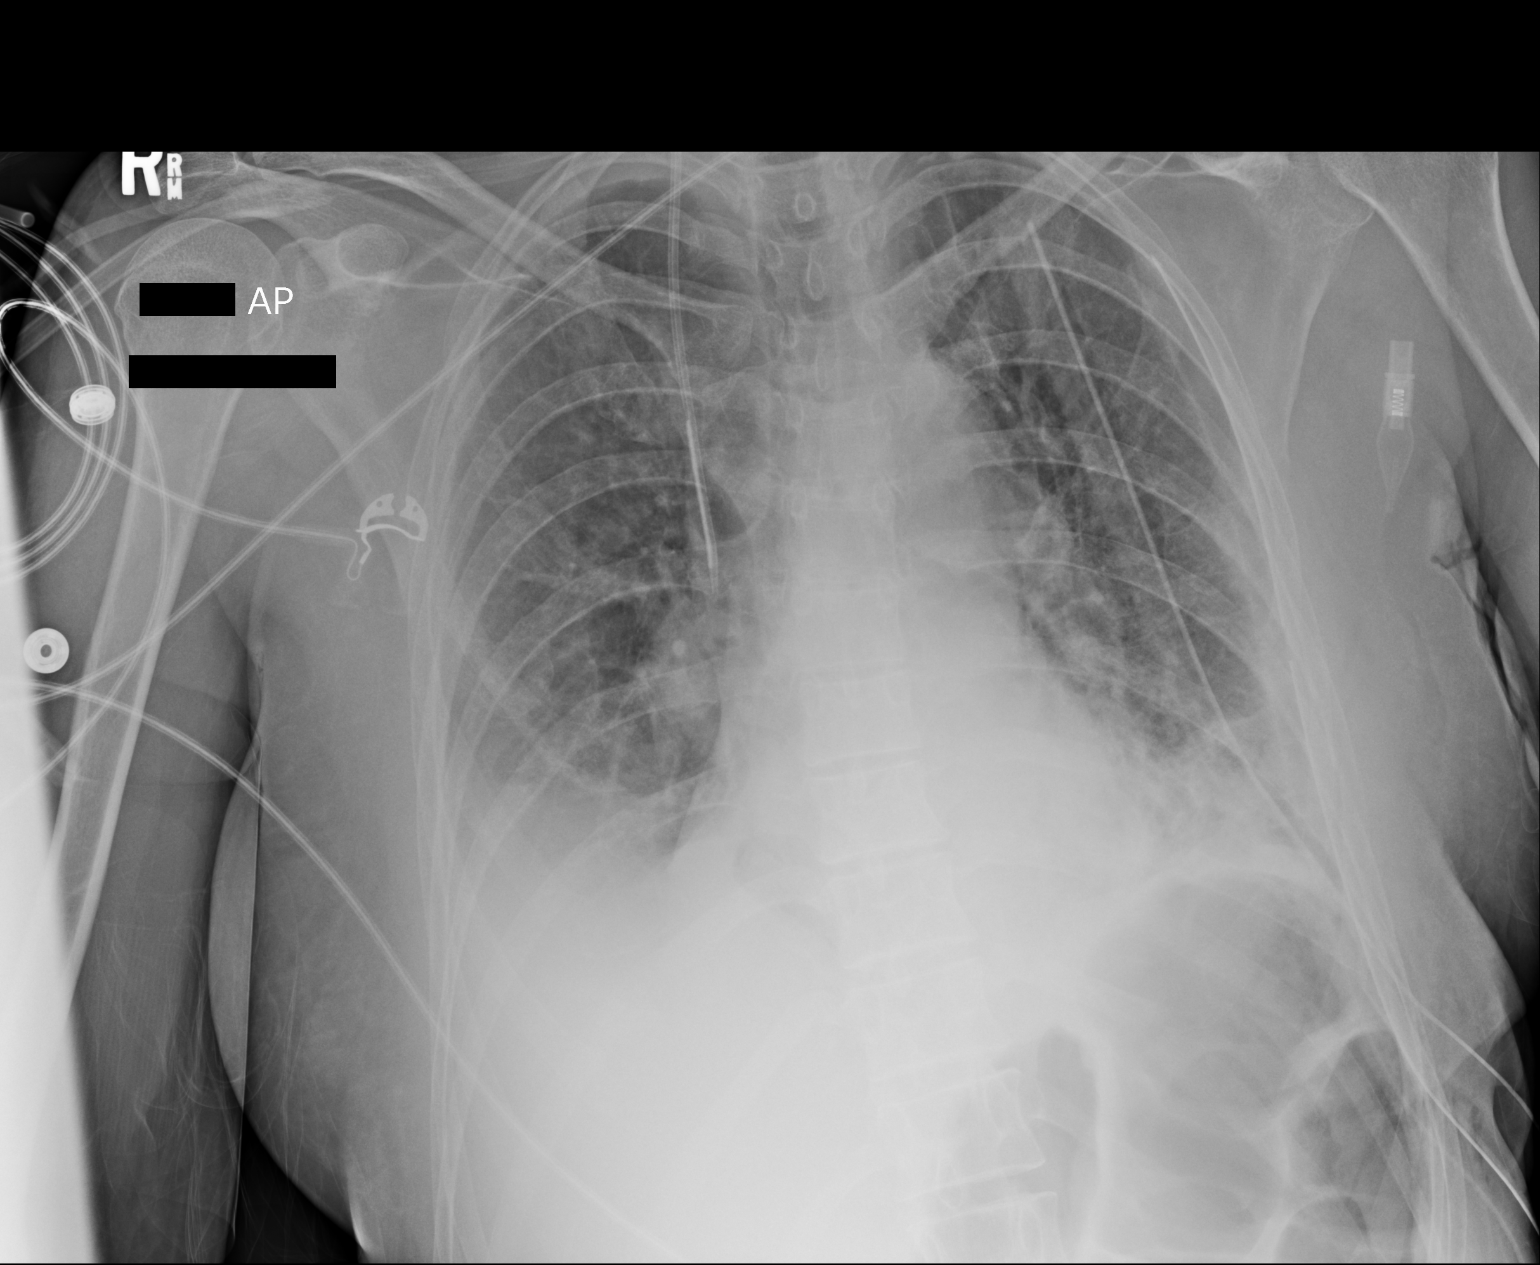

[1 of 1 positions shown; findings below may reference images not displayed]

FINDINGS: Cardiac shadow is stable. The previously seen left-sided chest tube
is been removed and 2 new chest tubes have been placed in the left
chest cavity. No pneumothorax is seen. Significant reduction in left
pleural effusion is noted. Left basilar atelectasis remains. A right
jugular central line is noted in the mid superior vena cava. Small
right-sided pleural effusion is noted. No focal infiltrate is seen.
IMPRESSION: Significant reduction in left pleural effusion following chest tube
placement.

Small right effusion.

Persistent left basilar atelectasis.

## 2015-10-06 IMAGING — CR DG CHEST 1V PORT
1 series · 1 of 1 positions shown · non-contrast
Comparison: 01/29/2014 at [DATE] a.m.

CLINICAL DATA: Left rib fractures.  Increased effusions.

EXAM:
PORTABLE CHEST - 1 VIEW [DATE] p.m.

[portable]
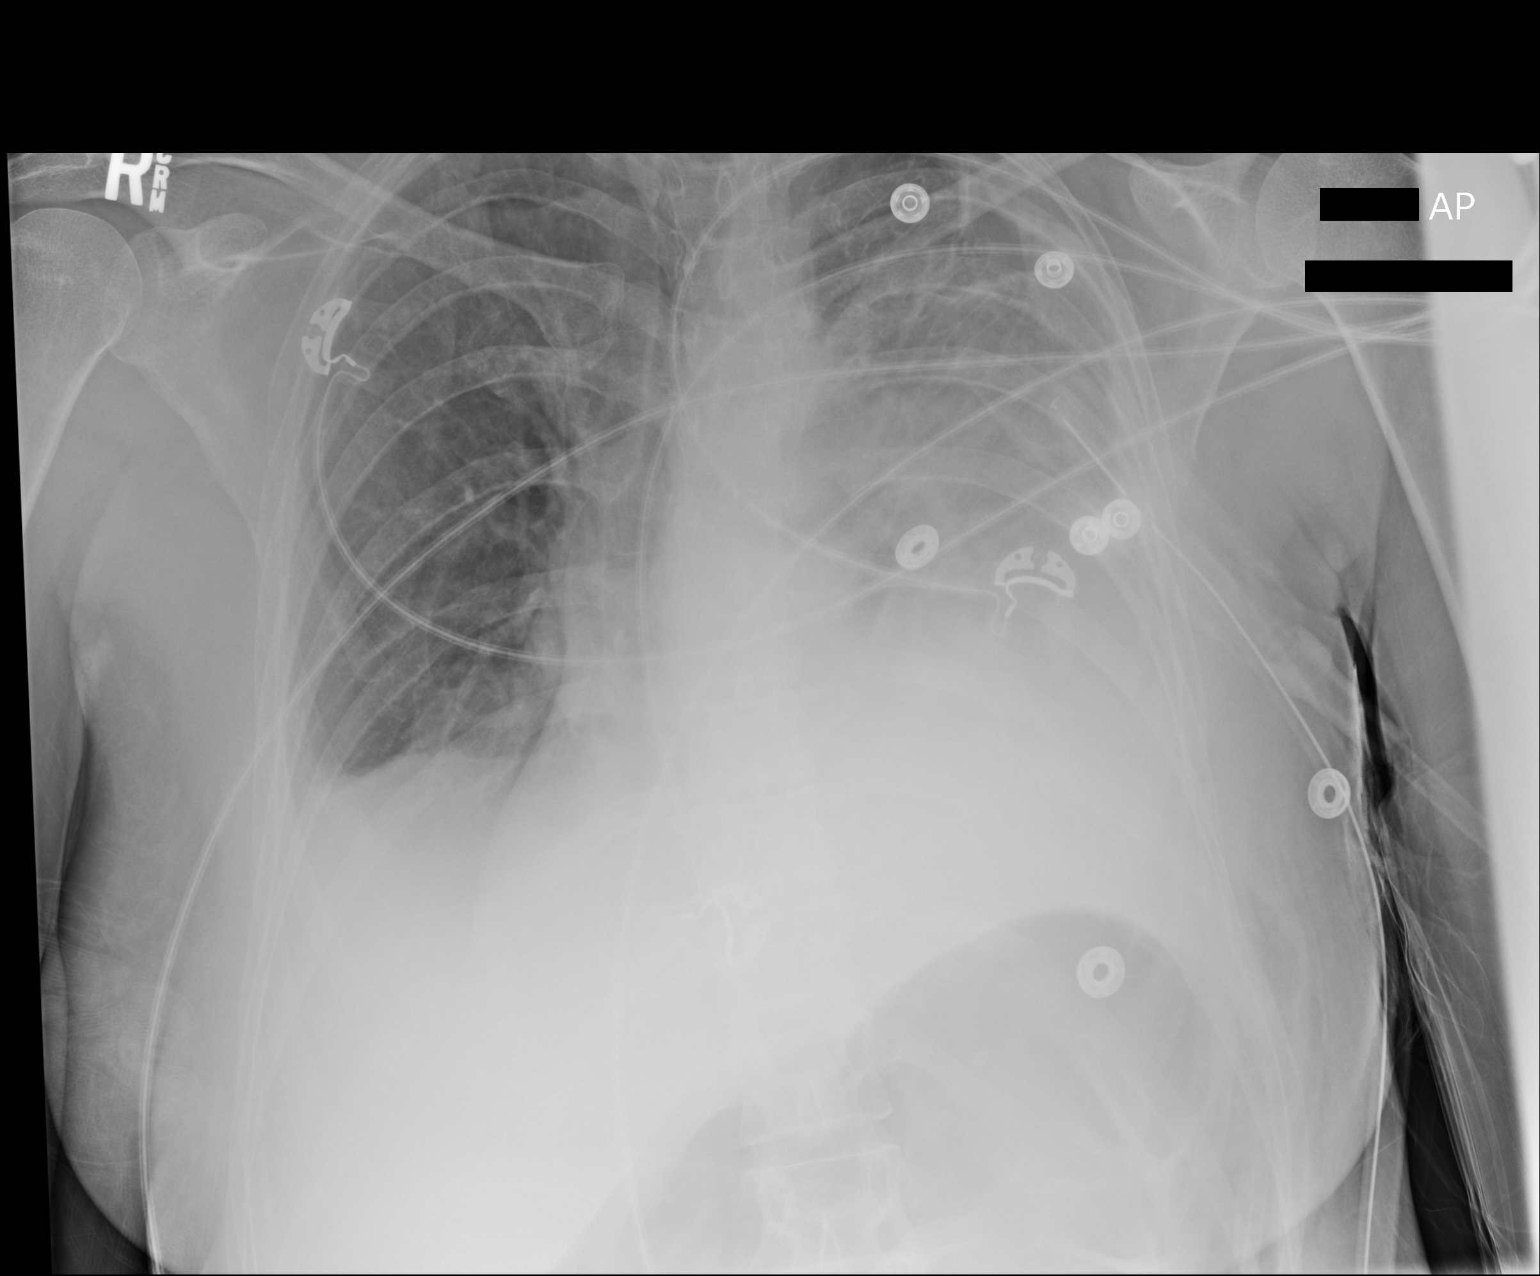

[1 of 1 positions shown; findings below may reference images not displayed]

FINDINGS: There has been a marked increase in the left pleural effusion.
Respiratory apparatus obscures the detail of the left lung apex.
There is no appreciable left pneumothorax.

There is a persistent small right pleural effusion. Pulmonary
vascularity is normal.
IMPRESSION: Marked increase in the left effusion. No visible pneumothorax but
the detail is obscured by respiratory apparatus.

## 2019-07-23 ENCOUNTER — Ambulatory Visit: Payer: Managed Care, Other (non HMO) | Attending: Internal Medicine

## 2019-07-23 DIAGNOSIS — Z23 Encounter for immunization: Secondary | ICD-10-CM

## 2019-07-23 NOTE — Progress Notes (Signed)
   Covid-19 Vaccination Clinic  Name:  Andrea Shaw    MRN: [id number] DOB: [birth year]  07/23/2019  [name] was observed post Covid-19 immunization for 15 minutes without incident. She was provided with Vaccine Information Sheet and instruction to access the V-Safe system.   [name] was instructed to call 911 with any severe reactions post vaccine: Marland Kitchen Difficulty breathing  . Swelling of face and throat  . A fast heartbeat  . A bad rash all over body  . Dizziness and weakness   Immunizations Administered    Name Date Dose VIS Date Route   Pfizer COVID-19 Vaccine 07/23/2019 12:28 PM 0.3 mL 04/20/2019 Intramuscular   Manufacturer: [employer], [employer]   Lot: [id number]   NDC: [address]-0

## 2019-08-14 ENCOUNTER — Ambulatory Visit: Payer: Managed Care, Other (non HMO) | Attending: Internal Medicine

## 2019-08-14 DIAGNOSIS — Z23 Encounter for immunization: Secondary | ICD-10-CM

## 2019-08-14 NOTE — Progress Notes (Signed)
   Covid-19 Vaccination Clinic  Name:  Andrea Shaw    MRN: [id number] DOB: [birth year]  08/14/2019  [name] was observed post Covid-19 immunization for 15 minutes without incident. She was provided with Vaccine Information Sheet and instruction to access the V-Safe system.   [name] was instructed to call 911 with any severe reactions post vaccine: Marland Kitchen Difficulty breathing  . Swelling of face and throat  . A fast heartbeat  . A bad rash all over body  . Dizziness and weakness   Immunizations Administered    Name Date Dose VIS Date Route   Pfizer COVID-19 Vaccine 08/14/2019  3:18 PM 0.3 mL 04/20/2019 Intramuscular   Manufacturer: [employer], [employer]   Lot: [id number]   NDC: [address]-4
